# Patient Record
Sex: Male | Born: 1970
Health system: Southern US, Community
[De-identification: ages and names within clinical notes are randomized; demographics above are authoritative.]

## PROBLEM LIST (undated history)

## (undated) DIAGNOSIS — D649 Anemia, unspecified: Secondary | ICD-10-CM

## (undated) DIAGNOSIS — I10 Essential (primary) hypertension: Secondary | ICD-10-CM

## (undated) DIAGNOSIS — H3321 Serous retinal detachment, right eye: Secondary | ICD-10-CM

## (undated) DIAGNOSIS — J189 Pneumonia, unspecified organism: Secondary | ICD-10-CM

## (undated) DIAGNOSIS — E119 Type 2 diabetes mellitus without complications: Secondary | ICD-10-CM

## (undated) DIAGNOSIS — N186 End stage renal disease: Secondary | ICD-10-CM

## (undated) HISTORY — PX: COLONOSCOPY: SHX174

## (undated) HISTORY — PX: RENAL BIOPSY: SHX156

## (undated) HISTORY — PX: KIDNEY TRANSPLANT: SHX239

---

## 2000-04-09 ENCOUNTER — Emergency Department (HOSPITAL_COMMUNITY): Admission: EM | Admit: 2000-04-09 | Discharge: 2000-04-09 | Payer: Self-pay | Admitting: Emergency Medicine

## 2000-04-09 ENCOUNTER — Encounter: Payer: Self-pay | Admitting: Emergency Medicine

## 2001-05-10 ENCOUNTER — Emergency Department (HOSPITAL_COMMUNITY): Admission: EM | Admit: 2001-05-10 | Discharge: 2001-05-10 | Payer: Self-pay | Admitting: Emergency Medicine

## 2002-08-16 ENCOUNTER — Emergency Department (HOSPITAL_COMMUNITY): Admission: EM | Admit: 2002-08-16 | Discharge: 2002-08-16 | Payer: Self-pay | Admitting: Emergency Medicine

## 2002-08-17 ENCOUNTER — Encounter: Admission: RE | Admit: 2002-08-17 | Discharge: 2002-08-17 | Payer: Self-pay | Admitting: Family Medicine

## 2002-08-18 ENCOUNTER — Encounter: Admission: RE | Admit: 2002-08-18 | Discharge: 2002-08-18 | Payer: Self-pay | Admitting: Family Medicine

## 2002-08-25 ENCOUNTER — Encounter: Admission: RE | Admit: 2002-08-25 | Discharge: 2002-08-25 | Payer: Self-pay | Admitting: Family Medicine

## 2002-08-28 ENCOUNTER — Encounter: Admission: RE | Admit: 2002-08-28 | Discharge: 2002-11-26 | Payer: Self-pay | Admitting: Family Medicine

## 2002-09-20 ENCOUNTER — Encounter: Admission: RE | Admit: 2002-09-20 | Discharge: 2002-09-20 | Payer: Self-pay | Admitting: Family Medicine

## 2002-11-24 ENCOUNTER — Encounter: Admission: RE | Admit: 2002-11-24 | Discharge: 2002-11-24 | Payer: Self-pay | Admitting: Family Medicine

## 2003-07-30 ENCOUNTER — Encounter: Admission: RE | Admit: 2003-07-30 | Discharge: 2003-07-30 | Payer: Self-pay | Admitting: Family Medicine

## 2004-05-01 ENCOUNTER — Ambulatory Visit: Payer: Self-pay | Admitting: Family Medicine

## 2005-07-17 ENCOUNTER — Emergency Department (HOSPITAL_COMMUNITY): Admission: EM | Admit: 2005-07-17 | Discharge: 2005-07-17 | Payer: Self-pay | Admitting: Emergency Medicine

## 2006-09-23 DIAGNOSIS — E669 Obesity, unspecified: Secondary | ICD-10-CM | POA: Insufficient documentation

## 2006-09-23 DIAGNOSIS — I10 Essential (primary) hypertension: Secondary | ICD-10-CM | POA: Insufficient documentation

## 2006-09-23 DIAGNOSIS — F1120 Opioid dependence, uncomplicated: Secondary | ICD-10-CM | POA: Insufficient documentation

## 2006-09-23 DIAGNOSIS — E119 Type 2 diabetes mellitus without complications: Secondary | ICD-10-CM

## 2006-10-14 ENCOUNTER — Emergency Department (HOSPITAL_COMMUNITY): Admission: EM | Admit: 2006-10-14 | Discharge: 2006-10-14 | Payer: Self-pay | Admitting: Family Medicine

## 2009-04-02 ENCOUNTER — Emergency Department (HOSPITAL_COMMUNITY): Admission: EM | Admit: 2009-04-02 | Discharge: 2009-04-02 | Payer: Self-pay | Admitting: Family Medicine

## 2010-10-31 LAB — POCT I-STAT, CHEM 8
BUN: 13 mg/dL (ref 6–23)
Calcium, Ion: 1.17 mmol/L (ref 1.12–1.32)
Chloride: 97 mEq/L (ref 96–112)
Creatinine, Ser: 0.8 mg/dL (ref 0.4–1.5)
Glucose, Bld: 407 mg/dL — ABNORMAL HIGH (ref 70–99)
HCT: 47 % (ref 39.0–52.0)
Hemoglobin: 16 g/dL (ref 13.0–17.0)
Potassium: 4.6 mEq/L (ref 3.5–5.1)
Sodium: 132 mEq/L — ABNORMAL LOW (ref 135–145)
TCO2: 25 mmol/L (ref 0–100)

## 2015-11-23 ENCOUNTER — Emergency Department (HOSPITAL_COMMUNITY)
Admission: EM | Admit: 2015-11-23 | Discharge: 2015-11-23 | Disposition: A | Discharge status: Home / Self Care | Payer: Self-pay | Attending: Emergency Medicine | Admitting: Emergency Medicine

## 2015-11-23 ENCOUNTER — Encounter (HOSPITAL_COMMUNITY): Payer: Self-pay

## 2015-11-23 DIAGNOSIS — S61011A Laceration without foreign body of right thumb without damage to nail, initial encounter: Secondary | ICD-10-CM | POA: Insufficient documentation

## 2015-11-23 DIAGNOSIS — W260XXA Contact with knife, initial encounter: Secondary | ICD-10-CM | POA: Insufficient documentation

## 2015-11-23 DIAGNOSIS — F17200 Nicotine dependence, unspecified, uncomplicated: Secondary | ICD-10-CM | POA: Insufficient documentation

## 2015-11-23 DIAGNOSIS — Z7984 Long term (current) use of oral hypoglycemic drugs: Secondary | ICD-10-CM | POA: Insufficient documentation

## 2015-11-23 DIAGNOSIS — Y998 Other external cause status: Secondary | ICD-10-CM | POA: Insufficient documentation

## 2015-11-23 DIAGNOSIS — Y9389 Activity, other specified: Secondary | ICD-10-CM | POA: Insufficient documentation

## 2015-11-23 DIAGNOSIS — Y9289 Other specified places as the place of occurrence of the external cause: Secondary | ICD-10-CM | POA: Insufficient documentation

## 2015-11-23 DIAGNOSIS — IMO0002 Reserved for concepts with insufficient information to code with codable children: Secondary | ICD-10-CM

## 2015-11-23 DIAGNOSIS — E119 Type 2 diabetes mellitus without complications: Secondary | ICD-10-CM | POA: Insufficient documentation

## 2015-11-23 HISTORY — DX: Type 2 diabetes mellitus without complications: E11.9

## 2015-11-23 MED ORDER — TETANUS-DIPHTH-ACELL PERTUSSIS 5-2.5-18.5 LF-MCG/0.5 IM SUSP
0.5000 mL | Freq: Once | INTRAMUSCULAR | Status: AC
  Administered 2015-11-23: 0.5 mL via INTRAMUSCULAR
  Filled 2015-11-23: qty 0.5

## 2015-11-23 MED ORDER — LIDOCAINE HCL 2 % IJ SOLN
5.0000 mL | Freq: Once | INTRAMUSCULAR | Status: AC
  Administered 2015-11-23: 100 mg
  Filled 2015-11-23: qty 20

## 2015-11-23 NOTE — ED Notes (Signed)
Pt states he accidentally cut the knuckle of his right thumb with his pocket knife. Small 1cm laceration to right thumb, scant bleeding.

## 2015-11-23 NOTE — Discharge Instructions (Signed)

## 2015-11-23 NOTE — ED Notes (Signed)
Patient verbalized understanding of discharge instructions and denies any further needs or questions at this time. VS stable. Patient ambulatory with steady gait.  

## 2015-11-23 NOTE — ED Provider Notes (Signed)
CSN: [id number]     Arrival date & time 11/23/15  0124 History  By signing my name below, I, Emmanuella Mensah, attest that this documentation has been prepared under the direction and in the presence of Orpah Greek, MD. Electronically Signed: Judithann Sauger, ED Scribe. 11/23/2015. 1:49 AM.    Chief Complaint  Patient presents with  . Extremity Laceration   The history is provided by the patient. No language interpreter was used.   HPI Comments: Casey Preston is a 45 y.o. male with a hx of DM who presents to the Emergency Department complaining of an actively bleeding laceration to the knuckle of his right thumb s/p right thumb injury that occurred PTA. He explains that he accidentally cut his right thumb with his pocket knife. No alleviating factors noted. Pt has not tried any medications PTA. No fever or rash.    Past Medical History  Diagnosis Date  . Diabetes mellitus without complication ([place])    History reviewed. No pertinent past surgical history. No family history on file. Social History  Substance Use Topics  . Smoking status: Current Every Day Smoker  . Smokeless tobacco: None  . Alcohol Use: No    Review of Systems  Constitutional: Negative for fever.  Skin: Positive for wound. Negative for rash.  All other systems reviewed and are negative.     Allergies  Review of patient's allergies indicates no known allergies.  Home Medications   Prior to Admission medications   Medication Sig Start Date End Date Taking? Authorizing Provider  metFORMIN (GLUCOPHAGE) 500 MG tablet Take 500 mg by mouth 2 (two) times daily with a meal.   Yes Historical Provider, MD   BP 159/95 mmHg  Pulse 101  Temp(Src) 98.8 F (37.1 C) (Oral)  Resp 16  SpO2 100% Physical Exam  Constitutional: He is oriented to person, place, and time. He appears well-developed and well-nourished. No distress.  HENT:  Head: Normocephalic and atraumatic.  Right Ear: Hearing normal.  Left  Ear: Hearing normal.  Nose: Nose normal.  Mouth/Throat: Oropharynx is clear and moist and mucous membranes are normal.  Eyes: Conjunctivae and EOM are normal. Pupils are equal, round, and reactive to light.  Neck: Normal range of motion. Neck supple.  Cardiovascular: Regular rhythm, S1 normal and S2 normal.  Exam reveals no gallop and no friction rub.   No murmur heard. Pulmonary/Chest: Effort normal and breath sounds normal. No respiratory distress. He exhibits no tenderness.  Abdominal: Soft. Normal appearance and bowel sounds are normal. There is no hepatosplenomegaly. There is no tenderness. There is no rebound, no guarding, no tenderness at McBurney's point and negative Murphy's sign. No hernia.  Musculoskeletal: Normal range of motion.  Normal flexion, extension, opposition of the thumb Normal cap refill Normal sensation   Neurological: He is alert and oriented to person, place, and time. He has normal strength. No cranial nerve deficit or sensory deficit. Coordination normal. GCS eye subscore is 4. GCS verbal subscore is 5. GCS motor subscore is 6.  Skin: Skin is warm, dry and intact. No rash noted. No cyanosis.  1 cm laceration over the ulna-radial aspect over the first MCP joint.   Psychiatric: He has a normal mood and affect. His speech is normal and behavior is normal. Thought content normal.  Nursing note and vitals reviewed.   ED Course  Procedures (including critical care time)  LACERATION REPAIR Performed by: Orpah Greek. Authorized by: Orpah Greek Consent: Verbal consent obtained. Risks  and benefits: risks, benefits and alternatives were discussed Consent given by: patient Patient identity confirmed: provided demographic data Prepped and Draped in normal sterile fashion Wound explored  Laceration Location: thumb  Laceration Length: 1cm  No Foreign Bodies seen or palpated  Anesthesia: local infiltration  Local anesthetic: lidocaine 2%  without epinephrine  Anesthetic total: 1 ml  Irrigation method: syringe Amount of cleaning: standard  Skin closure: suture  Number of sutures: 2  Technique: simple interrupted, 4-0 ethilon  Patient tolerance: Patient tolerated the procedure well with no immediate complications.  DIAGNOSTIC STUDIES: Oxygen Saturation is 99% on RA, normal by my interpretation.    COORDINATION OF CARE: 1:45 AM- Pt advised of plan for treatment and pt agrees. Pt will receive a laceration repair.   MDM   Final diagnoses:  Laceration    Presents to the ER for evaluation of laceration over the MCP joint of the thumb. Patient cut it on a knife earlier. Patient has normal range of motion, normal sensation. No evidence of tendon injury. No neurovascular injury. Sutures were placed, will have sutures removed in 10 days.  I personally performed the services described in this documentation, which was scribed in my presence. The recorded information has been reviewed and is accurate.'   Orpah Greek, MD 11/23/15 [phone]

## 2016-06-26 DIAGNOSIS — J189 Pneumonia, unspecified organism: Secondary | ICD-10-CM

## 2016-06-26 HISTORY — DX: Pneumonia, unspecified organism: J18.9

## 2016-07-13 ENCOUNTER — Encounter (HOSPITAL_COMMUNITY): Payer: Self-pay | Admitting: Emergency Medicine

## 2016-07-13 DIAGNOSIS — D631 Anemia in chronic kidney disease: Secondary | ICD-10-CM | POA: Diagnosis present

## 2016-07-13 DIAGNOSIS — T383X6A Underdosing of insulin and oral hypoglycemic [antidiabetic] drugs, initial encounter: Secondary | ICD-10-CM | POA: Diagnosis present

## 2016-07-13 DIAGNOSIS — N183 Chronic kidney disease, stage 3 (moderate): Secondary | ICD-10-CM | POA: Diagnosis present

## 2016-07-13 DIAGNOSIS — Y92009 Unspecified place in unspecified non-institutional (private) residence as the place of occurrence of the external cause: Secondary | ICD-10-CM

## 2016-07-13 DIAGNOSIS — E1122 Type 2 diabetes mellitus with diabetic chronic kidney disease: Secondary | ICD-10-CM | POA: Diagnosis present

## 2016-07-13 DIAGNOSIS — N179 Acute kidney failure, unspecified: Secondary | ICD-10-CM | POA: Diagnosis present

## 2016-07-13 DIAGNOSIS — I129 Hypertensive chronic kidney disease with stage 1 through stage 4 chronic kidney disease, or unspecified chronic kidney disease: Secondary | ICD-10-CM | POA: Diagnosis present

## 2016-07-13 DIAGNOSIS — J189 Pneumonia, unspecified organism: Principal | ICD-10-CM | POA: Diagnosis present

## 2016-07-13 DIAGNOSIS — Z23 Encounter for immunization: Secondary | ICD-10-CM

## 2016-07-13 DIAGNOSIS — D696 Thrombocytopenia, unspecified: Secondary | ICD-10-CM | POA: Diagnosis present

## 2016-07-13 DIAGNOSIS — Z833 Family history of diabetes mellitus: Secondary | ICD-10-CM

## 2016-07-13 DIAGNOSIS — Z91128 Patient's intentional underdosing of medication regimen for other reason: Secondary | ICD-10-CM

## 2016-07-13 DIAGNOSIS — F17290 Nicotine dependence, other tobacco product, uncomplicated: Secondary | ICD-10-CM | POA: Diagnosis present

## 2016-07-13 LAB — CBC WITH DIFFERENTIAL/PLATELET
BASOS ABS: 0 10*3/uL (ref 0.0–0.1)
BASOS PCT: 0 %
EOS ABS: 0 10*3/uL (ref 0.0–0.7)
EOS PCT: 0 %
HCT: 22.9 % — ABNORMAL LOW (ref 39.0–52.0)
Hemoglobin: 7.9 g/dL — ABNORMAL LOW (ref 13.0–17.0)
Lymphocytes Relative: 10 %
Lymphs Abs: 0.8 10*3/uL (ref 0.7–4.0)
MCH: 31.1 pg (ref 26.0–34.0)
MCHC: 34.5 g/dL (ref 30.0–36.0)
MCV: 90.2 fL (ref 78.0–100.0)
Monocytes Absolute: 0.5 10*3/uL (ref 0.1–1.0)
Monocytes Relative: 6 %
Neutro Abs: 7 10*3/uL (ref 1.7–7.7)
Neutrophils Relative %: 84 %
PLATELETS: 102 10*3/uL — AB (ref 150–400)
RBC: 2.54 MIL/uL — AB (ref 4.22–5.81)
RDW: 11.7 % (ref 11.5–15.5)
WBC: 8.3 10*3/uL (ref 4.0–10.5)

## 2016-07-13 LAB — URINALYSIS, ROUTINE W REFLEX MICROSCOPIC
BILIRUBIN URINE: NEGATIVE
GLUCOSE, UA: 150 mg/dL — AB
Ketones, ur: NEGATIVE mg/dL
Leukocytes, UA: NEGATIVE
NITRITE: NEGATIVE
Protein, ur: >=300 mg/dL — AB
SPECIFIC GRAVITY, URINE: 1.014 (ref 1.005–1.030)
pH: 5 (ref 5.0–8.0)

## 2016-07-13 LAB — COMPREHENSIVE METABOLIC PANEL
ALT: 31 U/L (ref 17–63)
AST: 40 U/L (ref 15–41)
Albumin: 3 g/dL — ABNORMAL LOW (ref 3.5–5.0)
Alkaline Phosphatase: 115 U/L (ref 38–126)
Anion gap: 4 — ABNORMAL LOW (ref 5–15)
BUN: 38 mg/dL — AB (ref 6–20)
CHLORIDE: 104 mmol/L (ref 101–111)
CO2: 27 mmol/L (ref 22–32)
CREATININE: 3.14 mg/dL — AB (ref 0.61–1.24)
Calcium: 8.7 mg/dL — ABNORMAL LOW (ref 8.9–10.3)
GFR calc non Af Amer: 23 mL/min — ABNORMAL LOW (ref >60)
GFR, EST AFRICAN AMERICAN: 26 mL/min — AB (ref >60)
Glucose, Bld: 121 mg/dL — ABNORMAL HIGH (ref 65–99)
POTASSIUM: 4.4 mmol/L (ref 3.5–5.1)
SODIUM: 135 mmol/L (ref 135–145)
Total Bilirubin: 1.2 mg/dL (ref 0.3–1.2)
Total Protein: 5.8 g/dL — ABNORMAL LOW (ref 6.5–8.1)

## 2016-07-13 MED ORDER — ONDANSETRON 4 MG PO TBDP
4.0000 mg | ORAL_TABLET | Freq: Once | ORAL | Status: AC
  Administered 2016-07-13: 4 mg via ORAL

## 2016-07-13 MED ORDER — ACETAMINOPHEN 325 MG PO TABS
650.0000 mg | ORAL_TABLET | Freq: Once | ORAL | Status: AC
  Administered 2016-07-13: 650 mg via ORAL

## 2016-07-13 MED ORDER — ACETAMINOPHEN 325 MG PO TABS
ORAL_TABLET | ORAL | Status: AC
  Filled 2016-07-13: qty 2

## 2016-07-13 MED ORDER — ONDANSETRON 4 MG PO TBDP
ORAL_TABLET | ORAL | Status: AC
  Filled 2016-07-13: qty 1

## 2016-07-13 NOTE — ED Triage Notes (Signed)
Pt states he has had N/V/D for past 2 days. Pt states he feels weak and tired for past week. Pt also complains of congestion. Pt states he was around his grandson who was sick this past weekend.

## 2016-07-14 ENCOUNTER — Encounter (HOSPITAL_COMMUNITY): Payer: Self-pay | Admitting: Emergency Medicine

## 2016-07-14 ENCOUNTER — Emergency Department (HOSPITAL_COMMUNITY): Payer: Self-pay

## 2016-07-14 ENCOUNTER — Inpatient Hospital Stay (HOSPITAL_COMMUNITY): Payer: Self-pay

## 2016-07-14 ENCOUNTER — Inpatient Hospital Stay (HOSPITAL_COMMUNITY)
Admission: EM | Admit: 2016-07-14 | Discharge: 2016-07-16 | DRG: 194 | Disposition: A | Discharge status: Home / Self Care | Payer: Self-pay | Attending: Internal Medicine | Admitting: Internal Medicine

## 2016-07-14 DIAGNOSIS — D593 Hemolytic-uremic syndrome, unspecified: Secondary | ICD-10-CM

## 2016-07-14 DIAGNOSIS — I10 Essential (primary) hypertension: Secondary | ICD-10-CM | POA: Diagnosis present

## 2016-07-14 DIAGNOSIS — N179 Acute kidney failure, unspecified: Secondary | ICD-10-CM | POA: Diagnosis present

## 2016-07-14 DIAGNOSIS — J189 Pneumonia, unspecified organism: Secondary | ICD-10-CM | POA: Diagnosis present

## 2016-07-14 DIAGNOSIS — E119 Type 2 diabetes mellitus without complications: Secondary | ICD-10-CM

## 2016-07-14 DIAGNOSIS — D696 Thrombocytopenia, unspecified: Secondary | ICD-10-CM | POA: Diagnosis present

## 2016-07-14 DIAGNOSIS — D649 Anemia, unspecified: Secondary | ICD-10-CM | POA: Diagnosis present

## 2016-07-14 DIAGNOSIS — M7989 Other specified soft tissue disorders: Secondary | ICD-10-CM

## 2016-07-14 HISTORY — DX: Essential (primary) hypertension: I10

## 2016-07-14 LAB — RESPIRATORY PANEL BY PCR
Adenovirus: NOT DETECTED
BORDETELLA PERTUSSIS-RVPCR: NOT DETECTED
CORONAVIRUS HKU1-RVPPCR: NOT DETECTED
Chlamydophila pneumoniae: NOT DETECTED
Coronavirus 229E: NOT DETECTED
Coronavirus NL63: NOT DETECTED
Coronavirus OC43: DETECTED — AB
Influenza A: NOT DETECTED
Influenza B: NOT DETECTED
METAPNEUMOVIRUS-RVPPCR: NOT DETECTED
Mycoplasma pneumoniae: NOT DETECTED
PARAINFLUENZA VIRUS 1-RVPPCR: NOT DETECTED
PARAINFLUENZA VIRUS 2-RVPPCR: NOT DETECTED
Parainfluenza Virus 3: NOT DETECTED
Parainfluenza Virus 4: NOT DETECTED
RHINOVIRUS / ENTEROVIRUS - RVPPCR: NOT DETECTED
Respiratory Syncytial Virus: DETECTED — AB

## 2016-07-14 LAB — GLUCOSE, CAPILLARY
GLUCOSE-CAPILLARY: 119 mg/dL — AB (ref 65–99)
GLUCOSE-CAPILLARY: 205 mg/dL — AB (ref 65–99)
GLUCOSE-CAPILLARY: 76 mg/dL (ref 65–99)
Glucose-Capillary: 142 mg/dL — ABNORMAL HIGH (ref 65–99)
Glucose-Capillary: 203 mg/dL — ABNORMAL HIGH (ref 65–99)

## 2016-07-14 LAB — BASIC METABOLIC PANEL
ANION GAP: 8 (ref 5–15)
BUN: 42 mg/dL — ABNORMAL HIGH (ref 6–20)
CALCIUM: 8.1 mg/dL — AB (ref 8.9–10.3)
CO2: 23 mmol/L (ref 22–32)
CREATININE: 3.29 mg/dL — AB (ref 0.61–1.24)
Chloride: 104 mmol/L (ref 101–111)
GFR, EST AFRICAN AMERICAN: 25 mL/min — AB (ref >60)
GFR, EST NON AFRICAN AMERICAN: 21 mL/min — AB (ref >60)
Glucose, Bld: 144 mg/dL — ABNORMAL HIGH (ref 65–99)
Potassium: 4.3 mmol/L (ref 3.5–5.1)
SODIUM: 135 mmol/L (ref 135–145)

## 2016-07-14 LAB — APTT: aPTT: 44 seconds — ABNORMAL HIGH (ref 24–36)

## 2016-07-14 LAB — PROTEIN / CREATININE RATIO, URINE
CREATININE, URINE: 168.38 mg/dL
Protein Creatinine Ratio: 4.64 mg/mg{Cre} — ABNORMAL HIGH (ref 0.00–0.15)
Total Protein, Urine: 781 mg/dL

## 2016-07-14 LAB — CBC WITH DIFFERENTIAL/PLATELET
BASOS ABS: 0 10*3/uL (ref 0.0–0.1)
Basophils Relative: 0 %
EOS ABS: 0 10*3/uL (ref 0.0–0.7)
Eosinophils Relative: 0 %
HEMATOCRIT: 21.2 % — AB (ref 39.0–52.0)
Hemoglobin: 7.3 g/dL — ABNORMAL LOW (ref 13.0–17.0)
LYMPHS ABS: 1.3 10*3/uL (ref 0.7–4.0)
Lymphocytes Relative: 15 %
MCH: 31.7 pg (ref 26.0–34.0)
MCHC: 34.4 g/dL (ref 30.0–36.0)
MCV: 92.2 fL (ref 78.0–100.0)
MONOS PCT: 7 %
Monocytes Absolute: 0.6 10*3/uL (ref 0.1–1.0)
NEUTROS ABS: 7 10*3/uL (ref 1.7–7.7)
Neutrophils Relative %: 78 %
PLATELETS: 84 10*3/uL — AB (ref 150–400)
RBC: 2.3 MIL/uL — AB (ref 4.22–5.81)
RDW: 11.9 % (ref 11.5–15.5)
WBC: 8.9 10*3/uL (ref 4.0–10.5)

## 2016-07-14 LAB — CBC
HEMATOCRIT: 20.7 % — AB (ref 39.0–52.0)
HEMOGLOBIN: 7 g/dL — AB (ref 13.0–17.0)
MCH: 30.4 pg (ref 26.0–34.0)
MCHC: 33.8 g/dL (ref 30.0–36.0)
MCV: 90 fL (ref 78.0–100.0)
Platelets: 98 10*3/uL — ABNORMAL LOW (ref 150–400)
RBC: 2.3 MIL/uL — AB (ref 4.22–5.81)
RDW: 11.6 % (ref 11.5–15.5)
WBC: 7.4 10*3/uL (ref 4.0–10.5)

## 2016-07-14 LAB — RAPID URINE DRUG SCREEN, HOSP PERFORMED
AMPHETAMINES: NOT DETECTED
BARBITURATES: NOT DETECTED
Benzodiazepines: NOT DETECTED
Cocaine: NOT DETECTED
Opiates: NOT DETECTED
TETRAHYDROCANNABINOL: POSITIVE — AB

## 2016-07-14 LAB — FIBRINOGEN: FIBRINOGEN: 547 mg/dL — AB (ref 210–475)

## 2016-07-14 LAB — LIPASE, BLOOD: Lipase: 22 U/L (ref 11–51)

## 2016-07-14 LAB — PROTIME-INR
INR: 1.05
Prothrombin Time: 13.7 seconds (ref 11.4–15.2)

## 2016-07-14 LAB — LACTATE DEHYDROGENASE: LDH: 387 U/L — AB (ref 98–192)

## 2016-07-14 LAB — SAVE SMEAR

## 2016-07-14 LAB — SEDIMENTATION RATE: Sed Rate: 60 mm/hr — ABNORMAL HIGH (ref 0–16)

## 2016-07-14 LAB — I-STAT CG4 LACTIC ACID, ED: LACTIC ACID, VENOUS: 0.94 mmol/L (ref 0.5–1.9)

## 2016-07-14 LAB — D-DIMER, QUANTITATIVE (NOT AT ARMC): D DIMER QUANT: 2.11 ug{FEU}/mL — AB (ref 0.00–0.50)

## 2016-07-14 LAB — C-REACTIVE PROTEIN: CRP: 8 mg/dL — AB (ref <1.0)

## 2016-07-14 MED ORDER — METOPROLOL TARTRATE 5 MG/5ML IV SOLN
5.0000 mg | Freq: Four times a day (QID) | INTRAVENOUS | Status: DC | PRN
  Administered 2016-07-16: 5 mg via INTRAVENOUS
  Filled 2016-07-14: qty 5

## 2016-07-14 MED ORDER — INSULIN ASPART 100 UNIT/ML ~~LOC~~ SOLN
0.0000 [IU] | Freq: Three times a day (TID) | SUBCUTANEOUS | Status: DC
  Administered 2016-07-14 – 2016-07-15 (×2): 3 [IU] via SUBCUTANEOUS
  Administered 2016-07-16: 2 [IU] via SUBCUTANEOUS
  Administered 2016-07-16: 1 [IU] via SUBCUTANEOUS

## 2016-07-14 MED ORDER — DEXTROSE 5 % IV SOLN
500.0000 mg | Freq: Once | INTRAVENOUS | Status: AC
  Administered 2016-07-14: 500 mg via INTRAVENOUS
  Filled 2016-07-14: qty 500

## 2016-07-14 MED ORDER — SODIUM CHLORIDE 0.9 % IV SOLN: INTRAVENOUS | Status: DC

## 2016-07-14 MED ORDER — PROMETHAZINE HCL 25 MG PO TABS: 12.5000 mg | ORAL_TABLET | Freq: Four times a day (QID) | ORAL | Status: DC | PRN

## 2016-07-14 MED ORDER — DEXTROSE 5 % IV SOLN
1.0000 g | INTRAVENOUS | Status: DC
  Administered 2016-07-14 – 2016-07-16 (×2): 1 g via INTRAVENOUS
  Filled 2016-07-14 (×2): qty 10

## 2016-07-14 MED ORDER — CEFTRIAXONE SODIUM 1 G IJ SOLR
1.0000 g | Freq: Once | INTRAMUSCULAR | Status: AC
  Administered 2016-07-14: 1 g via INTRAVENOUS
  Filled 2016-07-14: qty 10

## 2016-07-14 MED ORDER — DEXTROSE 5 % IV SOLN
500.0000 mg | INTRAVENOUS | Status: DC
  Administered 2016-07-15 – 2016-07-16 (×2): 500 mg via INTRAVENOUS
  Filled 2016-07-14 (×2): qty 500

## 2016-07-14 MED ORDER — HYDRALAZINE HCL 20 MG/ML IJ SOLN
10.0000 mg | INTRAMUSCULAR | Status: DC | PRN
Start: 2016-07-14 — End: 2016-07-14
  Administered 2016-07-14: 10 mg via INTRAVENOUS
  Filled 2016-07-14: qty 1

## 2016-07-14 MED ORDER — ACETAMINOPHEN 650 MG RE SUPP: 650.0000 mg | Freq: Four times a day (QID) | RECTAL | Status: DC | PRN

## 2016-07-14 MED ORDER — PNEUMOCOCCAL VAC POLYVALENT 25 MCG/0.5ML IJ INJ
0.5000 mL | INJECTION | INTRAMUSCULAR | Status: AC
  Administered 2016-07-15: 0.5 mL via INTRAMUSCULAR
  Filled 2016-07-14: qty 0.5

## 2016-07-14 MED ORDER — SODIUM CHLORIDE 0.9 % IV BOLUS (SEPSIS)
1500.0000 mL | Freq: Once | INTRAVENOUS | Status: AC
  Administered 2016-07-14: 1500 mL via INTRAVENOUS

## 2016-07-14 MED ORDER — IPRATROPIUM-ALBUTEROL 0.5-2.5 (3) MG/3ML IN SOLN: 3.0000 mL | RESPIRATORY_TRACT | Status: DC | PRN

## 2016-07-14 MED ORDER — INSULIN ASPART 100 UNIT/ML ~~LOC~~ SOLN: 0.0000 [IU] | Freq: Every day | SUBCUTANEOUS | Status: DC

## 2016-07-14 MED ORDER — HYDRALAZINE HCL 20 MG/ML IJ SOLN
10.0000 mg | INTRAMUSCULAR | Status: DC | PRN
  Administered 2016-07-16: 10 mg via INTRAVENOUS
  Filled 2016-07-14: qty 1

## 2016-07-14 MED ORDER — SODIUM CHLORIDE 0.9 % IV BOLUS (SEPSIS)
1000.0000 mL | Freq: Once | INTRAVENOUS | Status: AC
  Administered 2016-07-14: 1000 mL via INTRAVENOUS

## 2016-07-14 MED ORDER — ACETAMINOPHEN 325 MG PO TABS: 650.0000 mg | ORAL_TABLET | Freq: Four times a day (QID) | ORAL | Status: DC | PRN

## 2016-07-14 MED ORDER — METOPROLOL TARTRATE 5 MG/5ML IV SOLN
10.0000 mg | Freq: Once | INTRAVENOUS | Status: AC
  Administered 2016-07-14: 10 mg via INTRAVENOUS
  Filled 2016-07-14: qty 10

## 2016-07-14 MED ORDER — ONDANSETRON HCL 4 MG/2ML IJ SOLN
4.0000 mg | Freq: Once | INTRAMUSCULAR | Status: AC
  Administered 2016-07-14: 4 mg via INTRAVENOUS
  Filled 2016-07-14: qty 2

## 2016-07-14 MED ORDER — OXYCODONE HCL 5 MG PO TABS: 5.0000 mg | ORAL_TABLET | ORAL | Status: DC | PRN

## 2016-07-14 MED ORDER — SODIUM CHLORIDE 0.9 % IV SOLN
INTRAVENOUS | Status: DC
  Administered 2016-07-14: 125 mL via INTRAVENOUS
  Administered 2016-07-15: 22:00:00 via INTRAVENOUS

## 2016-07-14 NOTE — ED Provider Notes (Addendum)
[facility] DEPT Provider Note   CSN: [id number] Arrival date & time: 07/13/16  2100  By signing my name below, I, Delton Prairie, attest that this documentation has been prepared under the direction and in the presence of Merryl Hacker, MD  Electronically Signed: Delton Prairie, ED Scribe. 07/14/16. 1:03 AM.   History   Chief Complaint Chief Complaint  Patient presents with  . Nausea    The history is provided by the patient. No language interpreter was used.   HPI Comments:  Casey Preston is a 45 y.o. male, with a hx of DM, who presents to the Emergency Department complaining of sudden onset, persistent generalized fatigue x 4 days. Pt also reports fevers (tmax 102), a cough, nausea, vomiting, abdominal pain and diarrhea. He also notes sick contacts at home. No alleviating factors noted. Pt denies dysuria, hematuria, chest pain, hx of kidney disease, excessive aleve or Advil use, any other associated symptoms and any other modifying factors at this time. Pt is on Metformin for his DM but does not regularly take this medication. He notes tobacco use but denies alcohol use. States that he has been fatigued for over 2 weeks but has worsened over last 4 days.  PCP: Kandice Hams, MD  Past Medical History:  Diagnosis Date  . Diabetes mellitus without complication ([place])   . Hypertension     Patient Active Problem List   Diagnosis Date Noted  . 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 DIABETES MELLITUS Preston, UNCOMPLICATED 25/11/3974  . OBESITY, NOS 09/23/2006  . DRUG DEPENDENCE 09/23/2006  . HYPERTENSION, BENIGN SYSTEMIC 09/23/2006    History reviewed. No pertinent surgical history.     Home Medications    Prior to Admission medications   Not on File    Family History No family history on file.  Social History Social History  Substance Use Topics  . Smoking status: Never Smoker  . Smokeless tobacco: Current User    Types: Snuff  . Alcohol use No      Allergies   Patient has no known allergies.   Review of Systems Review of Systems  Constitutional: Positive for fatigue and fever.  Respiratory: Positive for cough.   Cardiovascular: Negative for chest pain.  Gastrointestinal: Positive for abdominal pain, diarrhea, nausea and vomiting.  Genitourinary: Negative for dysuria and hematuria.  All other systems reviewed and are negative.  Physical Exam Updated Vital Signs BP 188/100   Pulse 95   Temp 98.9 F (37.2 C) (Oral)   Resp 13   Ht 5\' 10"  (1.778 m)   Wt 180 lb (81.6 kg)   SpO2 98%   BMI 25.83 kg/m   Physical Exam  Constitutional: He is oriented to person, place, and time. He appears well-developed and well-nourished. No distress.  HENT:  Head: Normocephalic and atraumatic.  Conjunctiva pale, mucous membranes dry  Eyes: Pupils are equal, round, and reactive to light.  Cardiovascular: Normal rate, regular rhythm and normal heart sounds.   No murmur heard. Pulmonary/Chest: Effort normal and breath sounds normal. No respiratory distress. He has no wheezes.  Abdominal: Soft. Bowel sounds are normal. There is no tenderness. There is no rebound.  Musculoskeletal: He exhibits no edema.  Neurological: He is alert and oriented to person, place, and time.  Skin: Skin is warm and dry.  Psychiatric: He has a normal mood and affect.  Nursing note and vitals reviewed.    ED Treatments / Results  DIAGNOSTIC STUDIES:  Oxygen Saturation is 98%  on RA, normal by my interpretation.    COORDINATION OF CARE:  12:52 AM Discussed treatment plan with pt at bedside and pt agreed to plan.  Labs (all labs ordered are listed, but only abnormal results are displayed) Labs Reviewed  CBC WITH DIFFERENTIAL/PLATELET - Abnormal; Notable for the following:       Result Value   RBC 2.54 (*)    Hemoglobin 7.9 (*)    HCT 22.9 (*)    Platelets 102 (*)    All other components within normal limits  COMPREHENSIVE METABOLIC PANEL -  Abnormal; Notable for the following:    Glucose, Bld 121 (*)    BUN 38 (*)    Creatinine, Ser 3.14 (*)    Calcium 8.7 (*)    Total Protein 5.8 (*)    Albumin 3.0 (*)    GFR calc non Af Amer 23 (*)    GFR calc Af Amer 26 (*)    Anion gap 4 (*)    All other components within normal limits  URINALYSIS, ROUTINE W REFLEX MICROSCOPIC - Abnormal; Notable for the following:    APPearance CLOUDY (*)    Glucose, UA 150 (*)    Hgb urine dipstick LARGE (*)    Protein, ur >=300 (*)    Bacteria, UA RARE (*)    Squamous Epithelial / LPF 0-5 (*)    All other components within normal limits  LACTATE DEHYDROGENASE - Abnormal; Notable for the following:    LDH 387 (*)    All other components within normal limits  CULTURE, BLOOD (ROUTINE X 2)  CULTURE, BLOOD (ROUTINE X 2)  CULTURE, BLOOD (ROUTINE X 2)  CULTURE, BLOOD (ROUTINE X 2)  LIPASE, BLOOD  SAVE SMEAR  PROTEIN / CREATININE RATIO, URINE  I-STAT CG4 LACTIC ACID, ED  POC OCCULT BLOOD, ED    EKG  EKG Interpretation  Date/Time:  Tuesday July 14 2016 03:12:40 EST Ventricular Rate:  97 PR Interval:    QRS Duration: 81 QT Interval:  362 QTC Calculation: 460 R Axis:   62 Text Interpretation:  Sinus rhythm Confirmed by Dina Rich  MD, Jimena Wieczorek ([id number]) on 07/14/2016 3:16:09 AM       Radiology Dg Chest 2 View  Result Date: 07/14/2016 CLINICAL DATA:  Cough and fever.  Hypoxia.  Several days duration. EXAM: CHEST  2 VIEW COMPARISON:  None FINDINGS: There are patchy airspace opacities in the lateral bases. This could represent pneumonia. No pleural effusion. Normal pulmonary vasculature. Unremarkable hilar and mediastinal contours. IMPRESSION: Patchy airspace opacities in the bases. This could represent multifocal pneumonia. Followup PA and lateral chest X-ray is recommended in 3-4 weeks following trial of antibiotic therapy to ensure resolution and exclude underlying malignancy. Electronically Signed   By: Andreas Newport M.D.   On:  07/14/2016 02:16   US Renal  Result Date: 07/14/2016 CLINICAL DATA:  45 year old male with acute renal insufficiency and elevated BUN and creatinine. EXAM: RENAL / URINARY TRACT ULTRASOUND COMPLETE COMPARISON:  None. FINDINGS: Right Kidney: Length: 12 cm. Mild increased echotexture. No hydronephrosis or echogenic stone. Left Kidney: Length: 11 cm. Minimal atrophy. Possible minimal increased echotexture. No mass or hydronephrosis visualized. Bladder: Appears normal for degree of bladder distention. IMPRESSION: Minimally increased renal echotexture otherwise unremarkable ultrasound. Electronically Signed   By: Anner Crete M.D.   On: 07/14/2016 01:46    Procedures Procedures (including critical care time)  Medications Ordered in ED Medications  ondansetron (ZOFRAN-ODT) 4 MG disintegrating tablet (not administered)  acetaminophen (TYLENOL) 325 MG  tablet (not administered)  cefTRIAXone (ROCEPHIN) 1 g in dextrose 5 % 50 mL IVPB (1 g Intravenous New Bag/Given 07/14/16 0255)  azithromycin (ZITHROMAX) 500 mg in dextrose 5 % 250 mL IVPB (not administered)  ondansetron (ZOFRAN-ODT) disintegrating tablet 4 mg (4 mg Oral Given 07/13/16 2137)  acetaminophen (TYLENOL) tablet 650 mg (650 mg Oral Given 07/13/16 2136)  sodium chloride 0.9 % bolus 1,000 mL (1,000 mLs Intravenous New Bag/Given 07/14/16 0159)  ondansetron (ZOFRAN) injection 4 mg (4 mg Intravenous Given 07/14/16 0159)  sodium chloride 0.9 % bolus 1,500 mL (1,500 mLs Intravenous New Bag/Given 07/14/16 0255)     Initial Impression / Assessment and Plan / ED Course  I have reviewed the triage vital signs and the nursing notes.  Pertinent labs & imaging results that were available during my care of the patient were reviewed by me and considered in my medical decision making (see chart for details).  Clinical Course     Patient presents with generalized fatigue, fever, diarrhea over the last 4 days. Also reports a two-week history of  worsening fatigue. He is nontoxic on exam. Vital signs are reassuring. Temperature 98.9. Initial temperature 101.2.  Physical exam is largely benign. Lab work is markedly abnormal. He has acute kidney injury, thrombcytopenia, anemia.  Denies history of the same. In the setting of diarrhea and acute illness, as well as fever, patient's presentation is most consistent with hemolytic uremic syndrome. Additional lab work obtained. Patient given fluids. Will admit for further workup and management.  3:16 AM X-ray reviewed given fever and cough. Concerning for multifocal pneumonia. No risk factors for age. Patient given IV Rocephin and azithromycin. Lactate was added. Blood cultures pending. He was given a total of 30 mL/kg fluid. Discuss with Dr. Tamala Julian, hospitalist. Will admit to telemetry.  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 The recorded information has been reviewed and is accurate.     Merryl Hacker, MD 07/14/16 [id number]    Merryl Hacker, MD 07/14/16 [id number]    Merryl Hacker, MD 07/14/16 [phone]

## 2016-07-14 NOTE — ED Notes (Addendum)
EDP notified of pt's BP 189/100. Pt denies chest pain or headache. No new orders at this time.

## 2016-07-14 NOTE — Progress Notes (Signed)
Received sign out regarding patient from Dr. Dina Rich Casey Preston is a 45 year old with past medical history of DM type II; who presented with generalized fatigue, fever, abd pain, and N/V. Fever up to 101.42F, HR 103 , RR 23, BP up to 194/94.  Lab workhbg 7.9,  Cr 3.14, previously wnl. Patient was initially started on supportive care as there was concern for HUS. U/S without signs of obstruction. Chest x-ray showing possible multifocal pneumonia patient was given empiric antibiotics of ceftriaxone and azithromycin.

## 2016-07-14 NOTE — ED Notes (Signed)
Patient transported to Ultrasound 

## 2016-07-14 NOTE — Progress Notes (Signed)
New Admission Note:  Arrival Method: Stretcher from ED Mental Orientation: A&O x4 Telemetry: Box 15, CCMD Notified Assessment: Completed Skin: Assessed with Dorothey Baseman, RN, no skin issues noted IV: L AC saline locked. Pain: 0/10 Tubes: N/A Safety Measures: Safety Fall Prevention Plan discussed with patient Admission: Completed [address] Orientation: Patient has been orientated to the room, unit and the staff. Family: Wife at the bedside RN unable to complete abuse/neglect assessment due to wife being at the bedside. RN notified admissions for patient's arrival to unit. Awaiting orders. Will continue to monitor the patient. Call light has been placed within reach.     Nena Polio BSN, RN  Phone Number: [phone]

## 2016-07-14 NOTE — H&P (Signed)
History and Physical    Casey Preston HVF:[id number] DOB: 10/05/1970 DOA: 07/14/2016  PCP: Kandice Hams, MD Patient coming from: Home  Chief Complaint: Generalized weakness  HPI: COBI DELPH II is a 45 y.o. male with medical history significant of diabetes and hypertension presenting with generalized fatigue. Started 2 weeks ago but became significantly worse over the last 4 days ago. Associated with fever up to 102, intermittent productive cough, nausea, vomiting, mild abdominal discomfort and diarrhea. Multiple sick contacts at home recently. Denies using cigarettes but does use chewing tobacco. Patient also endorsing lower extremity swelling which is significantly worse on the right. Continues to make urine.  ED Course: objective findings outlined below  Review of Systems: As per HPI otherwise 10 point review of systems negative.   Ambulatory Status:no restrictions  Past Medical History:  Diagnosis Date  . Diabetes mellitus without complication ([place])   . Hypertension     History reviewed. No pertinent surgical history.  Social History   Social History  . Marital status: Married    Spouse name: N/A  . Number of children: N/A  . Years of education: N/A   Occupational History  . Not on file.   Social History Main Topics  . Smoking status: Never Smoker  . Smokeless tobacco: Current User    Types: Snuff  . Alcohol use No  . Drug use:     Types: Marijuana  . Sexual activity: Not on file   Other Topics Concern  . Not on file   Social History Narrative  . No narrative on file    No Known Allergies  Family History  Problem Relation Age of Onset  . Diabetes Mother   . Diabetes Father     Prior to Admission medications   Not on File    Physical Exam: Vitals:   07/14/16 0317 07/14/16 0359 07/14/16 0821 07/14/16 1049  BP: (!) 191/104 (!) 194/94 (!) 191/94 (!) 165/89  Pulse:  (!) 102 98 88  Resp: '19 18 18   ' Temp: 98.9 F (37.2 C) 99.3 F (37.4 C)  99.2 F (37.3 C)   TempSrc: Oral Oral Oral   SpO2: 95% 94% 91% 92%  Weight:  80.2 kg (176 lb 12.8 oz)    Height:  '5\' 10"'  (1.778 m)       General:  Appears calm and comfortable Eyes:  PERRL, EOMI, normal lids, iris ENT:  grossly normal hearing, lips & tongue, mmm Neck:  no LAD, masses or thyromegaly Cardiovascular:  RRR, no m/r/g. 2+ RLE swelling and trace LLE edema Respiratory: Coarse breath sounds throughout. Intermittent wheezing. Increased effort. Abdomen:  soft, ntnd, NABS Skin:  no rash or induration seen on limited exam Musculoskeletal:  grossly normal tone BUE/BLE, good ROM, no bony abnormality Psychiatric:  grossly normal mood and affect, speech fluent and appropriate, AOx3 Neurologic:  CN 2-12 grossly intact, moves all extremities in coordinated fashion, sensation intact  Labs on Admission: I have personally reviewed following labs and imaging studies  CBC:  Recent Labs Lab 07/13/16 2132 07/14/16 0727  WBC 8.3 8.9  NEUTROABS 7.0 7.0  HGB 7.9* 7.3*  HCT 22.9* 21.2*  MCV 90.2 92.2  PLT 102* 84*   Basic Metabolic Panel:  Recent Labs Lab 07/13/16 2132 07/14/16 0727  NA 135 135  K 4.4 4.3  CL 104 104  CO2 27 23  GLUCOSE 121* 144*  BUN 38* 42*  CREATININE 3.14* 3.29*  CALCIUM 8.7* 8.1*   GFR: Estimated Creatinine Clearance:  29.6 mL/min (by C-G formula based on SCr of 3.29 mg/dL (H)). Liver Function Tests:  Recent Labs Lab 07/13/16 2132  AST 40  ALT 31  ALKPHOS 115  BILITOT 1.2  PROT 5.8*  ALBUMIN 3.0*    Recent Labs Lab 07/14/16 0150  LIPASE 22   No results for input(s): AMMONIA in the last 168 hours. Coagulation Profile: No results for input(s): INR, PROTIME in the last 168 hours. Cardiac Enzymes: No results for input(s): CKTOTAL, CKMB, CKMBINDEX, TROPONINI in the last 168 hours. BNP (last 3 results) No results for input(s): PROBNP in the last 8760 hours. HbA1C: No results for input(s): HGBA1C in the last 72 hours. CBG:  Recent  Labs Lab 07/14/16 0356 07/14/16 0742  GLUCAP 119* 142*   Lipid Profile: No results for input(s): CHOL, HDL, LDLCALC, TRIG, CHOLHDL, LDLDIRECT in the last 72 hours. Thyroid Function Tests: No results for input(s): TSH, T4TOTAL, FREET4, T3FREE, THYROIDAB in the last 72 hours. Anemia Panel: No results for input(s): VITAMINB12, FOLATE, FERRITIN, TIBC, IRON, RETICCTPCT in the last 72 hours. Urine analysis:    Component Value Date/Time   COLORURINE YELLOW 07/13/2016 2132   APPEARANCEUR CLOUDY (A) 07/13/2016 2132   LABSPEC 1.014 07/13/2016 2132   PHURINE 5.0 07/13/2016 2132   GLUCOSEU 150 (A) 07/13/2016 2132   HGBUR LARGE (A) 07/13/2016 2132   BILIRUBINUR NEGATIVE 07/13/2016 2132   KETONESUR NEGATIVE 07/13/2016 2132   PROTEINUR >=300 (A) 07/13/2016 2132   NITRITE NEGATIVE 07/13/2016 2132   LEUKOCYTESUR NEGATIVE 07/13/2016 2132    Creatinine Clearance: Estimated Creatinine Clearance: 29.6 mL/min (by C-G formula based on SCr of 3.29 mg/dL (H)).  Sepsis Labs: '@LABRCNTIP' (procalcitonin:4,lacticidven:4) )No results found for this or any previous visit (from the past 240 hour(s)).   Radiological Exams on Admission: Dg Chest 2 View  Result Date: 07/14/2016 CLINICAL DATA:  Cough and fever.  Hypoxia.  Several days duration. EXAM: CHEST  2 VIEW COMPARISON:  None FINDINGS: There are patchy airspace opacities in the lateral bases. This could represent pneumonia. No pleural effusion. Normal pulmonary vasculature. Unremarkable hilar and mediastinal contours. IMPRESSION: Patchy airspace opacities in the bases. This could represent multifocal pneumonia. Followup PA and lateral chest X-ray is recommended in 3-4 weeks following trial of antibiotic therapy to ensure resolution and exclude underlying malignancy. Electronically Signed   By: Andreas Newport M.D.   On: 07/14/2016 02:16   US Renal  Result Date: 07/14/2016 CLINICAL DATA:  45 year old male with acute renal insufficiency and elevated BUN  and creatinine. EXAM: RENAL / URINARY TRACT ULTRASOUND COMPLETE COMPARISON:  None. FINDINGS: Right Kidney: Length: 12 cm. Mild increased echotexture. No hydronephrosis or echogenic stone. Left Kidney: Length: 11 cm. Minimal atrophy. Possible minimal increased echotexture. No mass or hydronephrosis visualized. Bladder: Appears normal for degree of bladder distention. IMPRESSION: Minimally increased renal echotexture otherwise unremarkable ultrasound. Electronically Signed   By: Anner Crete M.D.   On: 07/14/2016 01:46    EKG: Independently reviewed. Sinus . No ACS   Assessment/Plan Active Problems:   HUS (hemolytic uremic syndrome) (HCC)   Diabetes mellitus type 2, diet-controlled ([place])   Essential hypertension   Anemia   Thrombocytopenia (HCC)   Multifocal pneumonia   Acute renal failure (HCC)    Multifocal pneumonia: Noted on chest x-ray and symptomatic. - Pneumonia order set - Ceftriaxone and azithromycin - O2 when necessary comfort or hypoxemia - Follow-up cultures - Procalcitonin - Respiratory viral panel  Possible HUS: Creatinine 3.29, hemoglobin 7.3, platelets 84. All values previously normal. In setting  of acute illness may be related to HUS. Workup ongoing. - Peripheral smear, d-dimer, fibrinogen, ESR, CRP, complement, coags, serial CBC - Management of other conditions as outlined  Acute renal failure: Creatinine 3.29. BUN 42. Baseline creatinine 0.8, BUN 13. Suspect secondary to current infectious process and possible HUS. - Aggressive IVF - Treatment of pneumonia as above - SPEP/UPEP - renal ultrasound  - Nephrology if not improving   Thrombocytopenia/anemia: Down trending over the course of 8 hours since presentation to the ED with hemoglobin drop from 7.97.3 and platelet drop from 102-84. Baseline hemoglobin 16. - Treatment as above - CBC Q8 - Anemia panel - FOBT  DM: Not taking medications - SSI - A1c  HTN: not on any medications - Hydralazine,  Metoprolol PRN - Rx new regimen after acute illness resolves.    DVT prophylaxis: none - thrombocytopenia and concern for DVT so no SCD at time of admission  Code Status: full  Family Communication: wife  Disposition Plan: pending workup   Consults called: none  Admission status: inpt    Joellen Tullos J MD [facility]  If 7PM-7AM, please contact night-coverage [email or web address] Password [email or web address]  07/14/2016, 10:56 AM

## 2016-07-14 NOTE — Progress Notes (Signed)
**  Preliminary report by tech**  Bilateral lower extremity venous duplex completed. There is no evidence of deep or superficial vein thrombosis involving the right and left lower extremities. All visualized vessels appear patent and compressible. There is no evidence of Baker's cysts bilaterally.  07/14/16 1:37 PM Carlos Levering RVT

## 2016-07-15 LAB — HEMOGLOBIN A1C
HEMOGLOBIN A1C: 5.2 % (ref 4.8–5.6)
Mean Plasma Glucose: 103 mg/dL

## 2016-07-15 LAB — CBC
HCT: 19.1 % — ABNORMAL LOW (ref 39.0–52.0)
HCT: 20.7 % — ABNORMAL LOW (ref 39.0–52.0)
HEMATOCRIT: 19.1 % — AB (ref 39.0–52.0)
HEMOGLOBIN: 6.7 g/dL — AB (ref 13.0–17.0)
HEMOGLOBIN: 7 g/dL — AB (ref 13.0–17.0)
Hemoglobin: 6.6 g/dL — CL (ref 13.0–17.0)
MCH: 31.6 pg (ref 26.0–34.0)
MCH: 31.6 pg (ref 26.0–34.0)
MCH: 30.2 pg (ref 26.0–34.0)
MCHC: 34.6 g/dL (ref 30.0–36.0)
MCHC: 35.1 g/dL (ref 30.0–36.0)
MCHC: 33.8 g/dL (ref 30.0–36.0)
MCV: 91.4 fL (ref 78.0–100.0)
MCV: 90.1 fL (ref 78.0–100.0)
MCV: 89.2 fL (ref 78.0–100.0)
PLATELETS: 82 10*3/uL — AB (ref 150–400)
Platelets: 94 10*3/uL — ABNORMAL LOW (ref 150–400)
Platelets: 100 10*3/uL — ABNORMAL LOW (ref 150–400)
RBC: 2.09 MIL/uL — AB (ref 4.22–5.81)
RBC: 2.12 MIL/uL — ABNORMAL LOW (ref 4.22–5.81)
RBC: 2.32 MIL/uL — AB (ref 4.22–5.81)
RDW: 11.8 % (ref 11.5–15.5)
RDW: 11.6 % (ref 11.5–15.5)
RDW: 13.1 % (ref 11.5–15.5)
WBC: 7.5 10*3/uL (ref 4.0–10.5)
WBC: 7.8 10*3/uL (ref 4.0–10.5)
WBC: 7.7 10*3/uL (ref 4.0–10.5)

## 2016-07-15 LAB — GLUCOSE, CAPILLARY
GLUCOSE-CAPILLARY: 159 mg/dL — AB (ref 65–99)
GLUCOSE-CAPILLARY: 105 mg/dL — AB (ref 65–99)
GLUCOSE-CAPILLARY: 153 mg/dL — AB (ref 65–99)
Glucose-Capillary: 125 mg/dL — ABNORMAL HIGH (ref 65–99)
Glucose-Capillary: 161 mg/dL — ABNORMAL HIGH (ref 65–99)
Glucose-Capillary: 236 mg/dL — ABNORMAL HIGH (ref 65–99)

## 2016-07-15 LAB — STREP PNEUMONIAE URINARY ANTIGEN: Strep Pneumo Urinary Antigen: NEGATIVE

## 2016-07-15 LAB — C3 COMPLEMENT: C3 COMPLEMENT: 111 mg/dL (ref 82–167)

## 2016-07-15 LAB — DIFFERENTIAL
BASOS PCT: 0 %
Basophils Absolute: 0 10*3/uL (ref 0.0–0.1)
EOS ABS: 0.2 10*3/uL (ref 0.0–0.7)
EOS PCT: 2 %
LYMPHS PCT: 14 %
Lymphs Abs: 1.1 10*3/uL (ref 0.7–4.0)
MONOS PCT: 9 %
Monocytes Absolute: 0.8 10*3/uL (ref 0.1–1.0)
NEUTROS ABS: 6 10*3/uL (ref 1.7–7.7)
Neutrophils Relative %: 75 %

## 2016-07-15 LAB — VITAMIN B12: VITAMIN B 12: 347 pg/mL (ref 180–914)

## 2016-07-15 LAB — C4 COMPLEMENT: Complement C4, Body Fluid: 37 mg/dL (ref 14–44)

## 2016-07-15 LAB — COMPREHENSIVE METABOLIC PANEL
ALBUMIN: 2.5 g/dL — AB (ref 3.5–5.0)
ALT: 28 U/L (ref 17–63)
ANION GAP: 6 (ref 5–15)
AST: 37 U/L (ref 15–41)
Alkaline Phosphatase: 89 U/L (ref 38–126)
BILIRUBIN TOTAL: 0.7 mg/dL (ref 0.3–1.2)
BUN: 39 mg/dL — ABNORMAL HIGH (ref 6–20)
CO2: 23 mmol/L (ref 22–32)
Calcium: 8.4 mg/dL — ABNORMAL LOW (ref 8.9–10.3)
Chloride: 107 mmol/L (ref 101–111)
Creatinine, Ser: 3.15 mg/dL — ABNORMAL HIGH (ref 0.61–1.24)
GFR calc Af Amer: 26 mL/min — ABNORMAL LOW (ref >60)
GFR calc non Af Amer: 22 mL/min — ABNORMAL LOW (ref >60)
GLUCOSE: 160 mg/dL — AB (ref 65–99)
POTASSIUM: 4.1 mmol/L (ref 3.5–5.1)
Sodium: 136 mmol/L (ref 135–145)
TOTAL PROTEIN: 5.2 g/dL — AB (ref 6.5–8.1)

## 2016-07-15 LAB — HIV ANTIBODY (ROUTINE TESTING W REFLEX): HIV SCREEN 4TH GENERATION: NONREACTIVE

## 2016-07-15 LAB — COMPLEMENT, TOTAL

## 2016-07-15 LAB — ABO/RH: ABO/RH(D): O POS

## 2016-07-15 LAB — TECHNOLOGIST SMEAR REVIEW

## 2016-07-15 LAB — PREPARE RBC (CROSSMATCH)

## 2016-07-15 LAB — DIRECT ANTIGLOBULIN TEST (NOT AT ARMC)
DAT, IGG: NEGATIVE
DAT, complement: NEGATIVE

## 2016-07-15 LAB — OCCULT BLOOD X 1 CARD TO LAB, STOOL: Fecal Occult Bld: NEGATIVE

## 2016-07-15 LAB — TSH: TSH: 4.649 u[IU]/mL — AB (ref 0.350–4.500)

## 2016-07-15 MED ORDER — SODIUM CHLORIDE 0.9 % IV SOLN
Freq: Once | INTRAVENOUS | Status: AC
  Administered 2016-07-15: 16:00:00 via INTRAVENOUS

## 2016-07-15 MED ORDER — HYDRALAZINE HCL 50 MG PO TABS
50.0000 mg | ORAL_TABLET | Freq: Three times a day (TID) | ORAL | Status: DC
  Administered 2016-07-15 – 2016-07-16 (×5): 50 mg via ORAL
  Filled 2016-07-15 (×5): qty 1

## 2016-07-15 MED ORDER — ISOSORBIDE MONONITRATE ER 30 MG PO TB24
30.0000 mg | ORAL_TABLET | Freq: Every day | ORAL | Status: DC
  Administered 2016-07-15 – 2016-07-16 (×2): 30 mg via ORAL
  Filled 2016-07-15 (×2): qty 1

## 2016-07-15 NOTE — Progress Notes (Signed)
[facility] Progress Note  Patient: Casey Preston OVF:[id number]   PCP: Kandice Hams, MD DOB: [birth year]   DOA: 07/14/2016   DOS: 07/15/2016   Date of Service: the patient was seen and examined on 07/15/2016  Brief hospital course: Pt. with PMH of Diabetes; admitted on 07/14/2016, with complaint of shortness of breath and fatigue, was found to have acute kidney injury. Currently further plan is continue IV hydration.  Assessment and Plan: 1. Multifocal pneumonia Chest x-ray shows multifocal pneumonia. Along with acute kidney injury this finding could represent multiple situation, sepsis induced multiorgan damage, vasculitis syndrome involving kidney and lung. There was initial consideration off hemolytic syndrome but with normal fibrinogen level, normal INR and no schistocytes in the blood this finding is highly unlikely. continue IV ceftriaxone and azithromycin. Follow blood cultures as well. RSV and corona virus positive.  2. Acute kidney injury. His findings appears likely chronic kidney disease in the setting of chronic noncompliance with medications for diabetes as well as hypertension. Ultrasound renal unremarkable. Patient is urinating more than a liter a day. Patient denies any urinary symptoms. Urinalysis is unremarkable for any concerning finding. Vasculitis panel has been sent. Further workup in the morning. Discussed with nephrology recommends outpatient follow-up to establish care.  3. Essential hypertension. Antihypertensive medications added. Monitor.  4. History of diabetes. Hemoglobin A1c less than 6. Patient supposed to be on metformin as an outpatient but has not been taking on a regular basis. Currently we will be on sliding scale insulin.  5. Anemia. From was at opinion. Likely chronic in nature given the appearance of the peripheral smear as well as stabilization of the count. Hemoglobin less than 7 we'll transfuse 1 unit PRBC. Monitor PRBC  after transfusion. FOBT negative no evidence of hemodialysis.   Pain management: When necessary Tylenol Activity: No indication for physical therapy Bowel regimen: last BM prior to admission Diet: Renal diet carb modified DVT Prophylaxis: SCD  Advance goals of care discussion: Full code  Family Communication: family was present at bedside, at the time of interview. The pt provided permission to discuss medical plan with the family. Opportunity was given to ask question and all questions were answered satisfactorily.   Disposition:  Discharge to home. Expected discharge date: 07/17/2016, stabilization of H&H as well as renal function  Consultants: phone Consultation with Dr. Justin Mend nephrology Procedures: none  Antibiotics: Anti-infectives    Start     Dose/Rate Route Frequency Ordered Stop   07/15/16 0600  azithromycin (ZITHROMAX) 500 mg in dextrose 5 % 250 mL IVPB     500 mg 250 mL/hr over 60 Minutes Intravenous Every 24 hours 07/14/16 0743 07/21/16 0559   07/15/16 0000  cefTRIAXone (ROCEPHIN) 1 g in dextrose 5 % 50 mL IVPB     1 g 100 mL/hr over 30 Minutes Intravenous Every 24 hours 07/14/16 0743 07/20/16 2359   07/14/16 0245  cefTRIAXone (ROCEPHIN) 1 g in dextrose 5 % 50 mL IVPB     1 g 100 mL/hr over 30 Minutes Intravenous  Once 07/14/16 0232 07/14/16 0325   07/14/16 0245  azithromycin (ZITHROMAX) 500 mg in dextrose 5 % 250 mL IVPB     500 mg 250 mL/hr over 60 Minutes Intravenous  Once 07/14/16 0232 07/14/16 0555        Subjective: Denies any acute complaint at present. Feeling better. No nausea no vomiting no diarrhea no shortness of breath or chest pain.  Objective: Physical Exam: Vitals:   07/15/16 8416 07/15/16 6063  07/15/16 1533 07/15/16 1600  BP: (!) 180/96 (!) 164/100 (!) 152/79 (!) 172/84  Pulse: 93 91 90 87  Resp: 16 18 17 16   Temp: 98.7 F (37.1 C) 98.6 F (37 C) 98.7 F (37.1 C) 98.4 F (36.9 C)  TempSrc: Oral Oral Oral Oral  SpO2: 96% 97% 93% 95%    Weight:      Height:        Intake/Output Summary (Last 24 hours) at 07/15/16 1826 Last data filed at 07/15/16 1700  Gross per 24 hour  Intake          4222.92 ml  Output             1925 ml  Net          2297.92 ml   Filed Weights   07/13/16 2113 07/13/16 2122 07/14/16 0359  Weight: 81.7 kg (180 lb 3.2 oz) 81.6 kg (180 lb) 80.2 kg (176 lb 12.8 oz)    General: Alert, Awake and Oriented to Time, Place and Person. Appear in mild distress, affect appropriate Eyes: PERRL, Conjunctiva normal ENT: Oral Mucosa clear moist. Neck: no JVD, no Abnormal Mass Or lumps Cardiovascular: S1 and S2 Present, no Murmur, Respiratory: Bilateral Air entry equal and Decreased, no use of accessory muscle, Clear to Auscultation, no Crackles, no wheezes Abdomen: Bowel Sound present, Soft and no tenderness Skin: no redness, no Rash, no induration Extremities: no Pedal edema, no calf tenderness Neurologic: Grossly no focal neuro deficit. Bilaterally Equal motor strength  Data Reviewed: CBC:  Recent Labs Lab 07/13/16 2132 07/14/16 0727 07/14/16 1542 07/14/16 2318 07/15/16 0347 07/15/16 0850  WBC 8.3 8.9 7.4 7.5 7.8  --   NEUTROABS 7.0 7.0  --   --   --  6.0  HGB 7.9* 7.3* 7.0* 6.6* 6.7*  --   HCT 22.9* 21.2* 20.7* 19.1* 19.1*  --   MCV 90.2 92.2 90.0 91.4 90.1  --   PLT 102* 84* 98* 82* 94*  --    Basic Metabolic Panel:  Recent Labs Lab 07/13/16 2132 07/14/16 0727 07/15/16 0850  NA 135 135 136  K 4.4 4.3 4.1  CL 104 104 107  CO2 27 23 23   GLUCOSE 121* 144* 160*  BUN 38* 42* 39*  CREATININE 3.14* 3.29* 3.15*  CALCIUM 8.7* 8.1* 8.4*    Liver Function Tests:  Recent Labs Lab 07/13/16 2132 07/15/16 0850  AST 40 37  ALT 31 28  ALKPHOS 115 89  BILITOT 1.2 0.7  PROT 5.8* 5.2*  ALBUMIN 3.0* 2.5*    Recent Labs Lab 07/14/16 0150  LIPASE 22   No results for input(s): AMMONIA in the last 168 hours. Coagulation Profile:  Recent Labs Lab 07/14/16 0937  INR 1.05    Cardiac Enzymes: No results for input(s): CKTOTAL, CKMB, CKMBINDEX, TROPONINI in the last 168 hours. BNP (last 3 results) No results for input(s): PROBNP in the last 8760 hours.  CBG:  Recent Labs Lab 07/15/16 0605 07/15/16 0730 07/15/16 0745 07/15/16 1124 07/15/16 1747  GLUCAP 125* 159* 161* 236* 105*    Studies: No results found.   Scheduled Meds: . azithromycin  500 mg Intravenous Q24H  . cefTRIAXone (ROCEPHIN)  IV  1 g Intravenous Q24H  . hydrALAZINE  50 mg Oral Q8H  . insulin aspart  0-5 Units Subcutaneous QHS  . insulin aspart  0-9 Units Subcutaneous TID WC  . isosorbide mononitrate  30 mg Oral Daily   Continuous Infusions: . sodium chloride 125 mL (07/14/16 1758)  PRN Meds: acetaminophen **OR** acetaminophen, hydrALAZINE, ipratropium-albuterol, metoprolol, promethazine  Time spent: 30 minutes  Author: Berle Mull, MD [facility] Pager: [phone] 07/15/2016 6:26 PM  If 7PM-7AM, please contact night-coverage at [email or web address], password [facility]

## 2016-07-16 LAB — COMPREHENSIVE METABOLIC PANEL
ALT: 24 U/L (ref 17–63)
AST: 31 U/L (ref 15–41)
Albumin: 2.4 g/dL — ABNORMAL LOW (ref 3.5–5.0)
Alkaline Phosphatase: 83 U/L (ref 38–126)
Anion gap: 8 (ref 5–15)
BUN: 34 mg/dL — ABNORMAL HIGH (ref 6–20)
CO2: 20 mmol/L — ABNORMAL LOW (ref 22–32)
Calcium: 8.3 mg/dL — ABNORMAL LOW (ref 8.9–10.3)
Chloride: 110 mmol/L (ref 101–111)
Creatinine, Ser: 2.94 mg/dL — ABNORMAL HIGH (ref 0.61–1.24)
GFR calc Af Amer: 28 mL/min — ABNORMAL LOW (ref >60)
GFR calc non Af Amer: 24 mL/min — ABNORMAL LOW (ref >60)
Glucose, Bld: 124 mg/dL — ABNORMAL HIGH (ref 65–99)
Potassium: 4.2 mmol/L (ref 3.5–5.1)
Sodium: 138 mmol/L (ref 135–145)
Total Bilirubin: 0.7 mg/dL (ref 0.3–1.2)
Total Protein: 4.8 g/dL — ABNORMAL LOW (ref 6.5–8.1)

## 2016-07-16 LAB — CBC WITH DIFFERENTIAL/PLATELET
BASOS PCT: 0 %
Basophils Absolute: 0 10*3/uL (ref 0.0–0.1)
EOS ABS: 0.3 10*3/uL (ref 0.0–0.7)
EOS PCT: 3 %
HCT: 21.7 % — ABNORMAL LOW (ref 39.0–52.0)
HEMOGLOBIN: 7.5 g/dL — AB (ref 13.0–17.0)
Lymphocytes Relative: 19 %
Lymphs Abs: 1.5 10*3/uL (ref 0.7–4.0)
MCH: 30.5 pg (ref 26.0–34.0)
MCHC: 34.6 g/dL (ref 30.0–36.0)
MCV: 88.2 fL (ref 78.0–100.0)
Monocytes Absolute: 0.7 10*3/uL (ref 0.1–1.0)
Monocytes Relative: 9 %
NEUTROS PCT: 69 %
Neutro Abs: 5.6 10*3/uL (ref 1.7–7.7)
PLATELETS: 123 10*3/uL — AB (ref 150–400)
RBC: 2.46 MIL/uL — AB (ref 4.22–5.81)
RDW: 13.2 % (ref 11.5–15.5)
WBC: 8.1 10*3/uL (ref 4.0–10.5)

## 2016-07-16 LAB — ANCA TITERS
Atypical P-ANCA titer: <1:20 {titer}
P-ANCA: <1:20 {titer}

## 2016-07-16 LAB — LEGIONELLA PNEUMOPHILA SEROGP 1 UR AG: L. PNEUMOPHILA SEROGP 1 UR AG: NEGATIVE

## 2016-07-16 LAB — T4, FREE: Free T4: 0.96 ng/dL (ref 0.61–1.12)

## 2016-07-16 LAB — PROTEIN ELECTROPHORESIS, SERUM
A/G Ratio: 1.2 (ref 0.7–1.7)
ALPHA-2-GLOBULIN: 0.4 g/dL (ref 0.4–1.0)
Albumin ELP: 2.5 g/dL — ABNORMAL LOW (ref 2.9–4.4)
Alpha-1-Globulin: 0.3 g/dL (ref 0.0–0.4)
BETA GLOBULIN: 0.8 g/dL (ref 0.7–1.3)
Gamma Globulin: 0.6 g/dL (ref 0.4–1.8)
Globulin, Total: 2.1 g/dL — ABNORMAL LOW (ref 2.2–3.9)
M-SPIKE, %: 0.2 g/dL — AB
Total Protein ELP: 4.6 g/dL — ABNORMAL LOW (ref 6.0–8.5)

## 2016-07-16 LAB — TYPE AND SCREEN
ABO/RH(D): O POS
ANTIBODY SCREEN: NEGATIVE
Unit division: 0

## 2016-07-16 LAB — GLUCOSE, CAPILLARY
GLUCOSE-CAPILLARY: 176 mg/dL — AB (ref 65–99)
GLUCOSE-CAPILLARY: 146 mg/dL — AB (ref 65–99)

## 2016-07-16 LAB — IRON AND TIBC
Iron: 32 ug/dL — ABNORMAL LOW (ref 45–182)
SATURATION RATIOS: 14 % — AB (ref 17.9–39.5)
TIBC: 237 ug/dL — AB (ref 250–450)
UIBC: 205 ug/dL

## 2016-07-16 LAB — PROCALCITONIN: Procalcitonin: 0.31 ng/mL

## 2016-07-16 LAB — MPO/PR-3 (ANCA) ANTIBODIES: Myeloperoxidase Abs: <9 U/mL (ref 0.0–9.0)

## 2016-07-16 LAB — ANTINUCLEAR ANTIBODIES, IFA: ANA Ab, IFA: NEGATIVE

## 2016-07-16 LAB — IMMUNOFIXATION, URINE

## 2016-07-16 MED ORDER — AMLODIPINE BESYLATE 5 MG PO TABS: 5.0000 mg | ORAL_TABLET | Freq: Every day | ORAL | 0 refills | Status: DC

## 2016-07-16 MED ORDER — FERROUS SULFATE 325 (65 FE) MG PO TBEC: 325.0000 mg | DELAYED_RELEASE_TABLET | Freq: Three times a day (TID) | ORAL | 0 refills | Status: DC

## 2016-07-16 MED ORDER — AMLODIPINE BESYLATE 5 MG PO TABS
5.0000 mg | ORAL_TABLET | Freq: Every day | ORAL | Status: DC
  Administered 2016-07-16: 5 mg via ORAL
  Filled 2016-07-16: qty 1

## 2016-07-16 MED ORDER — ISOSORBIDE MONONITRATE ER 30 MG PO TB24: 30.0000 mg | ORAL_TABLET | Freq: Every day | ORAL | 0 refills | Status: DC

## 2016-07-16 MED ORDER — LEVOFLOXACIN 750 MG PO TABS: 750.0000 mg | ORAL_TABLET | ORAL | 0 refills | Status: AC

## 2016-07-16 MED ORDER — HYDRALAZINE HCL 50 MG PO TABS: 50.0000 mg | ORAL_TABLET | Freq: Three times a day (TID) | ORAL | 0 refills | Status: DC

## 2016-07-16 MED ORDER — FOLIC ACID 1 MG PO TABS: 1.0000 mg | ORAL_TABLET | Freq: Every day | ORAL | 0 refills | Status: DC

## 2016-07-16 MED ORDER — LEVOFLOXACIN 750 MG PO TABS
750.0000 mg | ORAL_TABLET | ORAL | Status: DC
  Administered 2016-07-16: 750 mg via ORAL
  Filled 2016-07-16: qty 1

## 2016-07-16 NOTE — Progress Notes (Signed)
Patient left the unit without letting anyone know.  Patient asked to let staff know before leaving the unit.

## 2016-07-16 NOTE — Consult Note (Signed)
[facility] Consultation Note Requesting Physician:  Dr. Posey Pronto Primary Care: Dr. Delfina Redwood  Reason for Consult:  Renal  insufficiency  HPI: The patient is a 45 y.o. year-old with PMH significant for DM, HTN admitted 12/19 with a febrile illness associated with cough, N/V/D. CXR patchy basilar air space disease, resp panel + for coronavirus and for RSV. BC's neg to date.   Noted on admission to have a creatinine of 3.14 (0.8 2010, 1.19 06/2014 at Dr. Lina Sar office). Urinalysis showed >300 protein and large Hb by dipstick, 6-30 RBC's on microscopic.  UPC 4.64 grams. C3 and C4 normal, HIV neg. Ultrasound with 12,11 cm kidneys R/L respectively w/only minimally increased echotexture.  Noted to have anemia (6.6-7.5) and thrombocytopenia (currently 123K and slowly improving) but no schistocytes seen on peripheral smear. UDS + for THC.   Date/Time Value  07/16/2016 07:42 AM 2.94 (H)  07/15/2016 08:50 AM 3.15 (H)  07/14/2016 07:27 AM 3.29 (H)  07/13/2016 09:32 PM 3.14 (H)  06/29/2014 1.19  ([facility])  04/02/2009 12:14 PM 0.8   Pt tells me that when he was initially dx with DM in 2002 he was told he had proteinuria (at time of Dx A1C >10, BS>600- subsequently lost from 240 lb to current weight, has not taken metformin for over a year, normal A1C now). For the past 2 years or more has noted foaminess to the urine, no tea or coca cola colored urine. Has new onset (this admission) LE pitting edema. Follows with Dr. Delfina Redwood - last labs from his office dated 06/2014 creatinine 1.19. No UA data available.   Past Medical History:  Diagnosis Date  . Diabetes mellitus without complication ([place])   . Hypertension     Past Surgical History: History reviewed. No pertinent surgical history.   Family History  Problem Relation Age of Onset  . Diabetes Mother   . Diabetes Father    Social History:  reports that he has never smoked. His smokeless tobacco use includes Snuff. He reports that he uses drugs, including  Marijuana. He reports that he does not drink alcohol.  Allergies: No Known Allergies  Home medications: Prior to Admission medications   Medication Sig Start Date End Date Taking? Authorizing Provider  amLODipine (NORVASC) 5 MG tablet Take 1 tablet (5 mg total) by mouth daily. 07/17/16   Lavina Hamman, MD  hydrALAZINE (APRESOLINE) 50 MG tablet Take 1 tablet (50 mg total) by mouth 3 (three) times daily. 07/16/16   Lavina Hamman, MD  isosorbide mononitrate (IMDUR) 30 MG 24 hr tablet Take 1 tablet (30 mg total) by mouth daily. 07/17/16   Lavina Hamman, MD  levofloxacin (LEVAQUIN) 750 MG tablet Take 1 tablet (750 mg total) by mouth every other day. 07/16/16 07/21/16  Lavina Hamman, MD    Inpatient medications: . amLODipine  5 mg Oral Daily  . hydrALAZINE  50 mg Oral Q8H  . insulin aspart  0-5 Units Subcutaneous QHS  . insulin aspart  0-9 Units Subcutaneous TID WC  . isosorbide mononitrate  30 mg Oral Daily  . levofloxacin  750 mg Oral Q48H    Review of Systems Gen:  Denies headache, fever, chills, sweats.  Intentional weight loss from 240 lb to current weight of 179 since 2002 HEENT:  No visual change, sore throat, difficulty swallowing. Resp:  No difficulty breathing, DOE.  No cough or hemoptysis.currently denies SOB Cardiac:  No chest pain, orthopnea, PND.  New onset edema this admission GI:   Denies abdominal  pain.   Resolved diarrhea  No constipation. GU:  Denies difficulty or change in voiding.  No change in urine color. Sudsy urine for about 2 years, no tea or coca cola colored urine    MS:  Denies joint pain or swelling.   Derm:  Denies skin rash or itching.  No chronic skin conditions.  Neuro:   Denies focal weakness, memory problems, hx stroke or TIA.   Psych:  Denies symptoms of depression of anxiety.  No hallucination.    Physical Exam:  Blood pressure (!) 154/89, pulse 86, temperature 98 F (36.7 C), resp. rate 18, height 5\' 10"  (1.778 m), weight 81.5 kg (179 lb 10.8  oz), SpO2 100 %.  Gen: Very nice slender AAM Lines/tubes: None Skin: no rash, cyanosis Neck: no JVD, no bruits or LAN Chest: Coarse BS, scattered wheezes Heart: Prominent precordium, loud s4 1/6 mumrmu LSB Abdomen: soft, no focal tenderness Ext: 2+ pitting edema bilateral LE's Neuro: alert, Ox3, no focal deficit Heme/Lymph: no bruising or LAN   Recent Labs Lab 07/13/16 2132 07/14/16 0727 07/15/16 0850 07/16/16 0742  NA 135 135 136 138  K 4.4 4.3 4.1 4.2  CL 104 104 107 110  CO2 27 23 23  20*  GLUCOSE 121* 144* 160* 124*  BUN 38* 42* 39* 34*  CREATININE 3.14* 3.29* 3.15* 2.94*  CALCIUM 8.7* 8.1* 8.4* 8.3*     Recent Labs Lab 07/13/16 2132 07/15/16 0850 07/16/16 0742  AST 40 37 31  ALT 31 28 24   ALKPHOS 115 89 83  BILITOT 1.2 0.7 0.7  PROT 5.8* 5.2* 4.8*  ALBUMIN 3.0* 2.5* 2.4*    Recent Labs Lab 07/14/16 0150  LIPASE 22   Urinalysis    Component Value Date/Time   COLORURINE YELLOW 07/13/2016 2132   APPEARANCEUR CLOUDY (A) 07/13/2016 2132   LABSPEC 1.014 07/13/2016 2132   PHURINE 5.0 07/13/2016 2132   GLUCOSEU 150 (A) 07/13/2016 2132   HGBUR LARGE (A) 07/13/2016 2132   BILIRUBINUR NEGATIVE 07/13/2016 2132   KETONESUR NEGATIVE 07/13/2016 2132   PROTEINUR >=300 (A) 07/13/2016 2132   NITRITE NEGATIVE 07/13/2016 2132   LEUKOCYTESUR NEGATIVE 07/13/2016 2132   Micro 6-30 RBC's.    Recent Labs Lab 07/13/16 2132 07/14/16 0727  07/14/16 2318 07/15/16 0347 07/15/16 0850 07/15/16 2037 07/16/16 0742  WBC 8.3 8.9  < > 7.5 7.8  --  7.7 8.1  NEUTROABS 7.0 7.0  --   --   --  6.0  --  5.6  HGB 7.9* 7.3*  < > 6.6* 6.7*  --  7.0* 7.5*  HCT 22.9* 21.2*  < > 19.1* 19.1*  --  20.7* 21.7*  MCV 90.2 92.2  < > 91.4 90.1  --  89.2 88.2  PLT 102* 84*  < > 82* 94*  --  100* 123*  < > = values in this interval not displayed.   Recent Labs Lab 07/15/16 1124 07/15/16 1747 07/15/16 2143 07/16/16 0825 07/16/16 1131  GLUCAP 236* 105* 153* 176* 146*   Results  for Casey Preston, Casey Preston (MRN [id number]) as of 07/16/2016 13:17  Ref. Range 07/14/2016 06:30  Hemoglobin A1C Latest Ref Range: 4.8 - 5.6 % 5.2    Dg Chest 2 View  Result Date: 07/14/2016 CLINICAL DATA:  Cough and fever.  Hypoxia.  Several days duration. EXAM: CHEST  2 VIEW COMPARISON:  None FINDINGS: There are patchy airspace opacities in the lateral bases. This could represent pneumonia. No pleural effusion. Normal pulmonary vasculature. Unremarkable hilar and  mediastinal contours. IMPRESSION: Patchy airspace opacities in the bases. This could represent multifocal pneumonia. Followup PA and lateral chest X-ray is recommended in 3-4 weeks following trial of antibiotic therapy to ensure resolution and exclude underlying malignancy. Electronically Signed   By: Andreas Newport M.D.   On: 07/14/2016 02:16   US Renal  Result Date: 07/14/2016 CLINICAL DATA:  45 year old male with acute renal insufficiency and elevated BUN and creatinine. EXAM: RENAL / URINARY TRACT ULTRASOUND COMPLETE COMPARISON:  None. FINDINGS: Right Kidney: Length: 12 cm. Mild increased echotexture. No hydronephrosis or echogenic stone. Left Kidney: Length: 11 cm. Minimal atrophy. Possible minimal increased echotexture. No mass or hydronephrosis visualized. Bladder: Appears normal for degree of bladder distention. IMPRESSION: Minimally increased renal echotexture otherwise unremarkable ultrasound. Electronically Signed   By: Anner Crete M.D.   On: 07/14/2016 01:46   Background:  45 y.o. year-old with PMH significant for DM, HTN admitted 12/19 with a febrile illness associated with cough, N/V/D. CXR patchy basilar air space disease, resp panel + for coronavirus and for RSV. BC's neg to date. Noted on admission to have a creatinine of 3.14 (0.8 2010, 1.19 2015). Urinalysis showed >300 protein and large Hb by dipstick, 6-30 RBC's on microscopic.  UPC 4.64 grams. C3 and C4 normal, HIV neg. Ultrasound with 12,11 cm kidneys R/L  respectively w/only minimally increased echotexture.  Noted to have anemia (6.6-7.5) and thrombocytopenia (currently 123K and slowly improving) but no schistocytes seen on peripheral smear. UDS + for THC. Pt reports told of proteinuria in 2002, and has had foamy urine for about 2 years. Lasat creatinine in outpt setting was 06/2014 1.19 and no UA data available. We were asked to see.  Assessment/Recommendations  1. Renal insufficiency  - Creatinine elevated on admission - hovering around 3, GFR around 24, with blood and protein in urine (nephrotic range based on UPC). Last creatinine from [facility] care was 06/2014 and at that time was 1.19. Has background of diabetes and (probably) poorly controlled HTN (does not check at home) and although could have sig proteinuria associated with diabetic kidney disease, the hematuria is not typical.  Kidneys with only minimal increase in echogenicity and normal sizes not typical for hypertensive disease either.  Reports h/o proteinuria since 2002, and foamy urine past 2 years. 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  I think we need to throw out slightly broader net in terms of serologic evaluation and he will probably require renal biopsy for diagnosis and prognosis. I have spoken with him and if we can get the lab studies today, I can follow him up in the office after the first of the year. (Appt scheduled for 07/31/16 at 10:30 AM)   2. Febrile illness with patch MF infiltrates. + coronavirus and RSV. S/p rocephin and azithro, On levaquin presently and afebrile.  3. Anemia - ? If related to renal ds. No evidence of hemolysis. Check Fe studies. (IF we ultimately recommend renal biopsy will need better Hb cushion). (Hb was 13.6 in 2014) 4. Thrombocytopenia - appears to be slowly improving and perhaps related to acute infection. Plts in 2014 were 166K. 5. HTN - poorly  controlled. Improving w/meds in the hospital.  6. Disposition: I have ordered ANA, ANCA, antiGBM, Hep C Ab, iron studies and PTH.  Once those labs are done, he could be discharged to home when medically and I will see him in followup  in the office 07/31/16 at 10:30 AM  Jamal Maes, MD [facility] [phone] Pager 07/16/2016, 1:38 PM

## 2016-07-16 NOTE — Progress Notes (Signed)
Discharge instructions and medications discussed with patient.  All questions answered.  

## 2016-07-16 NOTE — Discharge Instructions (Signed)
Food Basics for Chronic Kidney Disease When your kidneys are not working well, they cannot remove waste and excess substances from your blood as effectively as they did before. This can lead to a buildup and imbalance of these substances, which can affect how your body functions. This buildup can also make your kidneys work harder, causing even more damage. You may need to eat less of certain foods that can lead to the buildup of these substances in your body. By making the changes to your diet that are recommended by your dietitian or health care provider, you could possibly help prevent further kidney damage and delay or prevent the need for dialysis. The following information can help give you a basic understanding of these substances and how they affect your bodily functions. The information also gives examples of foods that contain the highest amounts of these substances. WHAT DO I NEED TO KNOW ABOUT SUBSTANCES IN MY FOOD THAT I MAY NEED TO ADJUST? Food adjustments will be different for each person with chronic kidney disease. It is important that you see a dietitian who can help you determine the specific adjustments that you will need to make for each of the following substances: Potassium  Potassium affects how steadily your heart beats. If too much potassium builds up in your blood, it can cause an irregular heartbeat or even a heart attack. Examples of foods rich in potassium include:  Milk.  Fruits.  Vegetables. Phosphorus  Phosphorus is a mineral found in your bones. A balance between calcium and phosphorous is needed to build and maintain healthy bones. Too much phosphorus pulls calcium from your bones. This can make your bones weak and more likely to break. Too much phosphorus can also make your skin itch. Examples of foods rich in phosphorus include:  Milk and cheese.  Dried beans.  Peas.  Colas.  Nuts and peanut butter. Animal Protein  Animal protein helps you make and  keep muscle. It also helps in the repair of your body's cells and tissues. One of the natural breakdown products of protein is a waste product called urea. When your kidneys are not working properly, they cannot remove wastes such as urea like they did before you developed chronic kidney disease. You will likely need to limit the amount of protein you eat to help prevent a buildup of urea in your blood. Examples of animal protein include:  Meat (all types).  Fish and seafood.  Poultry.  Eggs. Sodium  Sodium, which is found in salt, helps maintain a healthy balance of fluids in your body. Too much sodium can increase your blood pressure level and have a negative affect on the function of your heart and lungs. Too much sodium also can cause your body to retain too much fluid, making your kidneys work harder. Examples of foods with high levels of sodium include:  Salt seasonings.  Soy sauce.  Cured and processed meats.  Salted crackers and snack foods.  Fast food.  Canned soups and most canned foods. Glucose  Glucose provides energy for your body. If you have diabetes mellitus that is not properly controlled, you have too much glucose in your blood. Too much glucose in your blood can worsen the function of your kidneys by damaging small blood vessels. This prevents enough blood flow to your kidneys to give them what they need to work. If you have diabetes mellitus and chronic kidney disease, it is important to maintain your blood glucose at a level recommended by your  health care provider. SHOULD I TAKE A VITAMIN AND MINERAL SUPPLEMENT? Because you may need to avoid eating certain foods, you may not get all of the vitamins and minerals that would normally come from those foods. Your health care provider or dietitian may recommend that you take a supplement to ensure that you get all of the vitamins and minerals that your body needs.  This information is not intended to replace advice given  to you by your health care provider. Make sure you discuss any questions you have with your health care provider. Document Released: 10/03/2002 Document Revised: 08/03/2014 Document Reviewed: 06/09/2013 Elsevier Interactive Patient Education  2017 [employer].

## 2016-07-17 LAB — ANTISTREPTOLYSIN O TITER: ASO: 27 [IU]/mL (ref 0.0–200.0)

## 2016-07-17 LAB — HCV COMMENT:

## 2016-07-17 LAB — GLOMERULAR BASEMENT MEMBRANE ANTIBODIES: GBM Ab: 3 units (ref 0–20)

## 2016-07-17 LAB — HEPATITIS C ANTIBODY (REFLEX): HCV Ab: <0.1 s/co ratio (ref 0.0–0.9)

## 2016-07-19 LAB — CULTURE, BLOOD (ROUTINE X 2)
CULTURE: NO GROWTH
Culture: NO GROWTH
Culture: NO GROWTH
Culture: NO GROWTH

## 2016-07-20 NOTE — Discharge Summary (Signed)
[facility] Discharge Summary   Patient: Casey Preston PPJ:[id number]   PCP: Kandice Hams, MD DOB: 03/13/1971   Date of admission: 07/14/2016   Date of discharge: 07/16/2016     Discharge Diagnoses:  Principal Problem:   Multifocal pneumonia Active Problems:   Diabetes mellitus type 2, diet-controlled ([place])   Essential hypertension   Anemia   Thrombocytopenia ([facility])   Acute renal failure ([place]) Chronic kidney disease stage III  Admitted From: Home Disposition:  Home  Recommendations for Outpatient Follow-up:  1. Follow-up with PCP in one week. 2. Establish care and follow-up with nephrology as recommended   Follow-up Information    POLITE,RONALD D, MD. Schedule an appointment as soon as possible for a visit in 1 week(s).   Specialty:  Internal Medicine Contact information: [address] [place] [address] [phone]        Lucrezia Starch, MD. Go on 07/31/2016.   Specialty:  Nephrology Why:  10:30 AM Contact information: [address] [place] [address] [phone]          Diet recommendation: Renal diet  Activity: The patient is advised to gradually reintroduce usual activities.  Discharge Condition: good  Code Status: Full code  History of present illness: As per the H and P dictated on admission, "Casey Preston is a 45 y.o. male with medical history significant of diabetes and hypertension presenting with generalized fatigue. Started 2 weeks ago but became significantly worse over the last 4 days ago. Associated with fever up to 102, intermittent productive cough, nausea, vomiting, mild abdominal discomfort and diarrhea. Multiple sick contacts at home recently. Denies using cigarettes but does use chewing tobacco. Patient also endorsing lower extremity swelling which is significantly worse on the right. Continues to make urine."  Hospital Course:   Summary of his active problems in the hospital is as following. 1.  Multifocal pneumonia Chest x-ray shows multifocal pneumonia. Along with acute kidney injury this finding could represent multiple situation, sepsis induced multiorgan damage, vasculitis syndrome involving kidney and lung. There was initial consideration off hemolytic syndrome but with normal fibrinogen level, normal INR and no schistocytes in the blood this is highly unlikely. Patient was given IV ceftriaxone and azithromycin. Discharged on oral Levaquin Negative blood cultures as well. RSV and corona virus positive.  2. Acute kidney injury. More likely chronic kidney disease stage III His findings appears likely chronic kidney disease in the setting of chronic noncompliance with medications for diabetes as well as hypertension. Ultrasound renal unremarkable. Patient is urinating more than a liter a day. Patient denies any urinary symptoms. Urinalysis is unremarkable for any concerning finding. Vasculitis panel has been sent. Further workup currently pending Appreciate consultation with nephrology recommends outpatient follow-up to establish care.  3. Essential hypertension. Antihypertensive medications added. Monitor.  4. History of diabetes. Hemoglobin A1c less than 6. Patient supposed to be on metformin as an outpatient but has not been taking on a regular basis.  5. Anemia. Likely anemia of chronic kidney disease worsening due to hemodilution Likely chronic in nature given the appearance of the peripheral smear as well as stabilization of the count. Hemoglobin less than 7 patient was transfuse 1 unit PRBC. FOBT negative no evidence of hemodialysis. Recommend to initiate iron supplementation   All other chronic medical condition were stable during the hospitalization.  Patient was ambulatory without any assistance. On the day of the discharge the patient's vitals were stable, and no other acute medical condition were reported  by patient. the patient was felt safe to be  discharge at home.  Procedures and Results:   Lower extremity venous Doppler Summary:  - No evidence of deep vein or superficial thrombosis involving the   right lower extremity and left lower extremity. - No evidence of Baker&'s cyst on the right or left.   Consultations:  Nephrology  DISCHARGE MEDICATION: Discharge Medication List as of 07/16/2016  4:00 PM    START taking these medications   Details  amLODipine (NORVASC) 5 MG tablet Take 1 tablet (5 mg total) by mouth daily., Starting Fri 07/17/2016, Normal    ferrous sulfate (FE TABS) 325 (65 FE) MG EC tablet Take 1 tablet (325 mg total) by mouth 3 (three) times daily with meals., Starting Thu 02/72/5366, Normal    folic acid (FOLVITE) 1 MG tablet Take 1 tablet (1 mg total) by mouth daily., Starting Thu 07/16/2016, Normal    hydrALAZINE (APRESOLINE) 50 MG tablet Take 1 tablet (50 mg total) by mouth 3 (three) times daily., Starting Thu 07/16/2016, Normal    isosorbide mononitrate (IMDUR) 30 MG 24 hr tablet Take 1 tablet (30 mg total) by mouth daily., Starting Fri 07/17/2016, Normal    levofloxacin (LEVAQUIN) 750 MG tablet Take 1 tablet (750 mg total) by mouth every other day., Starting Thu 07/16/2016, Until Tue 07/21/2016, Normal       No Known Allergies  Discharge Exam: Filed Weights   07/13/16 2122 07/14/16 0359 07/15/16 2120  Weight: 81.6 kg (180 lb) 80.2 kg (176 lb 12.8 oz) 81.5 kg (179 lb 10.8 oz)   Vitals:   07/16/16 0651 07/16/16 0835  BP: (!) 193/89 (!) 154/89  Pulse: 90 86  Resp:  18  Temp:  98 F (36.7 C)   General: Appear in no distress, no Rash; Oral Mucosa moist. Cardiovascular: S1 and S2 Present, no Murmur, no JVD Respiratory: Bilateral Air entry present and Clear to Auscultation, no Crackles, no wheezes Abdomen: Bowel Sound present, Soft and no tenderness Extremities: no Pedal edema, no calf tenderness Neurology: Grossly no focal neuro deficit.  The results of significant diagnostics from  this hospitalization (including imaging, microbiology, ancillary and laboratory) are listed below for reference.    Significant Diagnostic Studies: Dg Chest 2 View  Result Date: 07/14/2016 CLINICAL DATA:  Cough and fever.  Hypoxia.  Several days duration. EXAM: CHEST  2 VIEW COMPARISON:  None FINDINGS: There are patchy airspace opacities in the lateral bases. This could represent pneumonia. No pleural effusion. Normal pulmonary vasculature. Unremarkable hilar and mediastinal contours. IMPRESSION: Patchy airspace opacities in the bases. This could represent multifocal pneumonia. Followup PA and lateral chest X-ray is recommended in 3-4 weeks following trial of antibiotic therapy to ensure resolution and exclude underlying malignancy. Electronically Signed   By: Andreas Newport M.D.   On: 07/14/2016 02:16   US Renal  Result Date: 07/14/2016 CLINICAL DATA:  45 year old male with acute renal insufficiency and elevated BUN and creatinine. EXAM: RENAL / URINARY TRACT ULTRASOUND COMPLETE COMPARISON:  None. FINDINGS: Right Kidney: Length: 12 cm. Mild increased echotexture. No hydronephrosis or echogenic stone. Left Kidney: Length: 11 cm. Minimal atrophy. Possible minimal increased echotexture. No mass or hydronephrosis visualized. Bladder: Appears normal for degree of bladder distention. IMPRESSION: Minimally increased renal echotexture otherwise unremarkable ultrasound. Electronically Signed   By: Anner Crete M.D.   On: 07/14/2016 01:46    Microbiology: Recent Results (from the past 240 hour(s))  Blood culture (routine x 2)     Status: None  Collection Time: 07/14/16  1:50 AM  Result Value Ref Range Status   Specimen Description BLOOD RIGHT ARM  Final   Special Requests BOTTLES DRAWN AEROBIC AND ANAEROBIC 5CC EA  Final   Culture NO GROWTH 5 DAYS  Final   Report Status 07/19/2016 FINAL  Final  Blood culture (routine x 2)     Status: None   Collection Time: 07/14/16  1:57 AM  Result Value Ref  Range Status   Specimen Description BLOOD LEFT ARM  Final   Special Requests IN PEDIATRIC BOTTLE 2CC  Final   Culture NO GROWTH 5 DAYS  Final   Report Status 07/19/2016 FINAL  Final  Blood culture (routine x 2)     Status: None   Collection Time: 07/14/16  4:15 AM  Result Value Ref Range Status   Specimen Description BLOOD RIGHT ARM  Final   Special Requests BOTTLES DRAWN AEROBIC AND ANAEROBIC 5ML  Final   Culture NO GROWTH 5 DAYS  Final   Report Status 07/19/2016 FINAL  Final  Blood culture (routine x 2)     Status: None   Collection Time: 07/14/16  4:21 AM  Result Value Ref Range Status   Specimen Description BLOOD LEFT ARM  Final   Special Requests BOTTLES DRAWN AEROBIC AND ANAEROBIC 6ML  Final   Culture NO GROWTH 5 DAYS  Final   Report Status 07/19/2016 FINAL  Final  Respiratory Panel by PCR     Status: Abnormal   Collection Time: 07/14/16 11:19 AM  Result Value Ref Range Status   Adenovirus NOT DETECTED NOT DETECTED Final   Coronavirus 229E NOT DETECTED NOT DETECTED Final   Coronavirus HKU1 NOT DETECTED NOT DETECTED Final   Coronavirus NL63 NOT DETECTED NOT DETECTED Final   Coronavirus OC43 DETECTED (A) NOT DETECTED Final   Metapneumovirus NOT DETECTED NOT DETECTED Final   Rhinovirus / Enterovirus NOT DETECTED NOT DETECTED Final   Influenza A NOT DETECTED NOT DETECTED Final   Influenza B NOT DETECTED NOT DETECTED Final   Parainfluenza Virus 1 NOT DETECTED NOT DETECTED Final   Parainfluenza Virus 2 NOT DETECTED NOT DETECTED Final   Parainfluenza Virus 3 NOT DETECTED NOT DETECTED Final   Parainfluenza Virus 4 NOT DETECTED NOT DETECTED Final   Respiratory Syncytial Virus DETECTED (A) NOT DETECTED Final    Comment: CRITICAL RESULT CALLED TO, READ BACK BY AND VERIFIED WITH: Gordy Clement, RN AT 1405 ON 07/14/16 BY C. JESSUP, MLT.    Bordetella pertussis NOT DETECTED NOT DETECTED Final   Chlamydophila pneumoniae NOT DETECTED NOT DETECTED Final   Mycoplasma pneumoniae NOT DETECTED  NOT DETECTED Final     Labs: CBC:  Recent Labs Lab 07/13/16 2132 07/14/16 0727 07/14/16 1542 07/14/16 2318 07/15/16 0347 07/15/16 0850 07/15/16 2037 07/16/16 0742  WBC 8.3 8.9 7.4 7.5 7.8  --  7.7 8.1  NEUTROABS 7.0 7.0  --   --   --  6.0  --  5.6  HGB 7.9* 7.3* 7.0* 6.6* 6.7*  --  7.0* 7.5*  HCT 22.9* 21.2* 20.7* 19.1* 19.1*  --  20.7* 21.7*  MCV 90.2 92.2 90.0 91.4 90.1  --  89.2 88.2  PLT 102* 84* 98* 82* 94*  --  100* 267*   Basic Metabolic Panel:  Recent Labs Lab 07/13/16 2132 07/14/16 0727 07/15/16 0850 07/16/16 0742  NA 135 135 136 138  K 4.4 4.3 4.1 4.2  CL 104 104 107 110  CO2 27 23 23  20*  GLUCOSE 121* 144* 160*  124*  BUN 38* 42* 39* 34*  CREATININE 3.14* 3.29* 3.15* 2.94*  CALCIUM 8.7* 8.1* 8.4* 8.3*   Liver Function Tests:  Recent Labs Lab 07/13/16 2132 07/15/16 0850 07/16/16 0742  AST 40 37 31  ALT 31 28 24   ALKPHOS 115 89 83  BILITOT 1.2 0.7 0.7  PROT 5.8* 5.2* 4.8*  ALBUMIN 3.0* 2.5* 2.4*    Recent Labs Lab 07/14/16 0150  LIPASE 22   No results for input(s): AMMONIA in the last 168 hours. Cardiac Enzymes: No results for input(s): CKTOTAL, CKMB, CKMBINDEX, TROPONINI in the last 168 hours. BNP (last 3 results) No results for input(s): BNP in the last 8760 hours. CBG:  Recent Labs Lab 07/15/16 1124 07/15/16 1747 07/15/16 2143 07/16/16 0825 07/16/16 1131  GLUCAP 236* 105* 153* 176* 146*   Time spent: 30 minutes  Signed:  Bryceson Grape  [facility] 07/16/2016 , 8:46 AM

## 2016-07-28 LAB — PARATHYROID HORMONE, INTACT (NO CA): PTH: 101 pg/mL — AB (ref 15–65)

## 2016-08-10 ENCOUNTER — Other Ambulatory Visit (HOSPITAL_COMMUNITY): Payer: Self-pay | Admitting: *Deleted

## 2016-08-11 ENCOUNTER — Encounter (HOSPITAL_COMMUNITY)
Admission: RE | Admit: 2016-08-11 | Discharge: 2016-08-11 | Disposition: A | Discharge status: Home / Self Care | Payer: Medicaid Other | Source: Ambulatory Visit | Attending: Nephrology | Admitting: Nephrology

## 2016-08-11 DIAGNOSIS — D631 Anemia in chronic kidney disease: Secondary | ICD-10-CM | POA: Diagnosis not present

## 2016-08-11 DIAGNOSIS — N184 Chronic kidney disease, stage 4 (severe): Secondary | ICD-10-CM | POA: Diagnosis not present

## 2016-08-11 DIAGNOSIS — D509 Iron deficiency anemia, unspecified: Secondary | ICD-10-CM | POA: Diagnosis present

## 2016-08-11 LAB — POCT HEMOGLOBIN-HEMACUE: HEMOGLOBIN: 7.2 g/dL — AB (ref 13.0–17.0)

## 2016-08-11 MED ORDER — DARBEPOETIN ALFA 200 MCG/0.4ML IJ SOSY
PREFILLED_SYRINGE | INTRAMUSCULAR | Status: AC
  Filled 2016-08-11: qty 0.4

## 2016-08-11 MED ORDER — SODIUM CHLORIDE 0.9 % IV SOLN
510.0000 mg | Freq: Once | INTRAVENOUS | Status: AC
  Administered 2016-08-11: 13:00:00 510 mg via INTRAVENOUS
  Filled 2016-08-11: qty 17

## 2016-08-11 MED ORDER — DARBEPOETIN ALFA 200 MCG/0.4ML IJ SOSY
200.0000 ug | PREFILLED_SYRINGE | INTRAMUSCULAR | Status: DC
  Administered 2016-08-11: 13:00:00 200 ug via SUBCUTANEOUS

## 2016-08-11 NOTE — Progress Notes (Signed)
Pt came in today for scheduled Feraheme and aranesp.  His BP on arrival 196/109.  Pt stated that he did not take his BP medicine today.  His hgb 7.2.  It was his first appointment.  I called [facility] and talked to [place].  She called Dr Lorrene Reid.  We gave the patient his 200 of aranesp as ordered.  I instructed patient on the importance of taking his BP medication an hour prior to his next appointments.  Pt verbalized understanding of my instructions

## 2016-08-11 NOTE — Discharge Instructions (Signed)
Darbepoetin Alfa injection °What is this medicine? °DARBEPOETIN ALFA (dar be POE e tin AL fa) helps your body make more red blood cells. It is used to treat anemia caused by chronic kidney failure and chemotherapy. °This medicine may be used for other purposes; ask your health care provider or pharmacist if you have questions. °COMMON BRAND NAME(S): Aranesp °What should I tell my health care provider before I take this medicine? °They need to know if you have any of these conditions: °-blood clotting disorders or history of blood clots °-cancer patient not on chemotherapy °-cystic fibrosis °-heart disease, such as angina, heart failure, or a history of a heart attack °-hemoglobin level of 12 g/dL or greater °-high blood pressure °-low levels of folate, iron, or vitamin B12 °-seizures °-an unusual or allergic reaction to darbepoetin, erythropoietin, albumin, hamster proteins, latex, other medicines, foods, dyes, or preservatives °-pregnant or trying to get pregnant °-breast-feeding °How should I use this medicine? °This medicine is for injection into a vein or under the skin. It is usually given by a health care professional in a hospital or clinic setting. °If you get this medicine at home, you will be taught how to prepare and give this medicine. Use exactly as directed. Take your medicine at regular intervals. Do not take your medicine more often than directed. °It is important that you put your used needles and syringes in a special sharps container. Do not put them in a trash can. If you do not have a sharps container, call your pharmacist or healthcare provider to get one. °A special MedGuide will be given to you by the pharmacist with each prescription and refill. Be sure to read this information carefully each time. °Talk to your pediatrician regarding the use of this medicine in children. While this medicine may be used in children as young as 1 year for selected conditions, precautions do  apply. °Overdosage: If you think you have taken too much of this medicine contact a poison control center or emergency room at once. °NOTE: This medicine is only for you. Do not share this medicine with others. °What if I miss a dose? °If you miss a dose, take it as soon as you can. If it is almost time for your next dose, take only that dose. Do not take double or extra doses. °What may interact with this medicine? °Do not take this medicine with any of the following medications: °-epoetin alfa °This list may not describe all possible interactions. Give your health care provider a list of all the medicines, herbs, non-prescription drugs, or dietary supplements you use. Also tell them if you smoke, drink alcohol, or use illegal drugs. Some items may interact with your medicine. °What should I watch for while using this medicine? °Your condition will be monitored carefully while you are receiving this medicine. °You may need blood work done while you are taking this medicine. °What side effects may I notice from receiving this medicine? °Side effects that you should report to your doctor or health care professional as soon as possible: °-allergic reactions like skin rash, itching or hives, swelling of the face, lips, or tongue °-breathing problems °-changes in vision °-chest pain °-confusion, trouble speaking or understanding °-feeling faint or lightheaded, falls °-high blood pressure °-muscle aches or pains °-pain, swelling, warmth in the leg °-rapid weight gain °-severe headaches °-sudden numbness or weakness of the face, arm or leg °-trouble walking, dizziness, loss of balance or coordination °-seizures (convulsions) °-swelling of the ankles, feet, hands °-  unusually weak or tired Side effects that usually do not require medical attention (report to your doctor or health care professional if they continue or are bothersome): -diarrhea -fever, chills (flu-like symptoms) -headaches -nausea, vomiting -redness,  stinging, or swelling at site where injected This list may not describe all possible side effects. Call your doctor for medical advice about side effects. You may report side effects to FDA at 1-800-FDA-1088. Where should I keep my medicine? Keep out of the reach of children. Store in a refrigerator between 2 and 8 degrees C (36 and 46 degrees F). Do not freeze. Do not shake. Throw away any unused portion if using a single-dose vial. Throw away any unused medicine after the expiration date. NOTE: This sheet is a summary. It may not cover all possible information. If you have questions about this medicine, talk to your doctor, pharmacist, or health care provider.  2017 Elsevier/Gold Standard (2016-03-02 19:52:26) Ferumoxytol injection What is this medicine? FERUMOXYTOL is an iron complex. Iron is used to make healthy red blood cells, which carry oxygen and nutrients throughout the body. This medicine is used to treat iron deficiency anemia in people with chronic kidney disease. COMMON BRAND NAME(S): Feraheme What should I tell my health care provider before I take this medicine? They need to know if you have any of these conditions: -anemia not caused by low iron levels -high levels of iron in the blood -magnetic resonance imaging (MRI) test scheduled -an unusual or allergic reaction to iron, other medicines, foods, dyes, or preservatives -pregnant or trying to get pregnant -breast-feeding How should I use this medicine? This medicine is for injection into a vein. It is given by a health care professional in a hospital or clinic setting. Talk to your pediatrician regarding the use of this medicine in children. Special care may be needed. What if I miss a dose? It is important not to miss your dose. Call your doctor or health care professional if you are unable to keep an appointment. What may interact with this medicine? This medicine may interact with the following medications: -other iron  products What should I watch for while using this medicine? Visit your doctor or healthcare professional regularly. Tell your doctor or healthcare professional if your symptoms do not start to get better or if they get worse. You may need blood work done while you are taking this medicine. You may need to follow a special diet. Talk to your doctor. Foods that contain iron include: whole grains/cereals, dried fruits, beans, or peas, leafy green vegetables, and organ meats (liver, kidney). What side effects may I notice from receiving this medicine? Side effects that you should report to your doctor or health care professional as soon as possible: -allergic reactions like skin rash, itching or hives, swelling of the face, lips, or tongue -breathing problems -changes in blood pressure -feeling faint or lightheaded, falls -fever or chills -flushing, sweating, or hot feelings -swelling of the ankles or feet Side effects that usually do not require medical attention (report to your doctor or health care professional if they continue or are bothersome): -diarrhea -headache -nausea, vomiting -stomach pain Where should I keep my medicine? This drug is given in a hospital or clinic and will not be stored at home.  2017 Elsevier/Gold Standard (2015-08-15 12:41:49)

## 2016-08-25 ENCOUNTER — Encounter (HOSPITAL_COMMUNITY)
Admission: RE | Admit: 2016-08-25 | Discharge: 2016-08-25 | Disposition: A | Discharge status: Home / Self Care | Payer: Medicaid Other | Source: Ambulatory Visit | Attending: Nephrology | Admitting: Nephrology

## 2016-08-25 DIAGNOSIS — N184 Chronic kidney disease, stage 4 (severe): Secondary | ICD-10-CM | POA: Diagnosis not present

## 2016-08-25 DIAGNOSIS — D631 Anemia in chronic kidney disease: Secondary | ICD-10-CM

## 2016-08-25 MED ORDER — DARBEPOETIN ALFA 200 MCG/0.4ML IJ SOSY
PREFILLED_SYRINGE | INTRAMUSCULAR | Status: AC
  Administered 2016-08-25: 11:00:00 200 ug via SUBCUTANEOUS
  Filled 2016-08-25: qty 0.4

## 2016-08-25 MED ORDER — DARBEPOETIN ALFA 200 MCG/0.4ML IJ SOSY
200.0000 ug | PREFILLED_SYRINGE | INTRAMUSCULAR | Status: DC
  Administered 2016-08-25: 200 ug via SUBCUTANEOUS

## 2016-08-26 LAB — POCT HEMOGLOBIN-HEMACUE: Hemoglobin: 9.3 g/dL — ABNORMAL LOW (ref 13.0–17.0)

## 2016-09-08 ENCOUNTER — Encounter (HOSPITAL_COMMUNITY)
Admission: RE | Admit: 2016-09-08 | Discharge: 2016-09-08 | Disposition: A | Discharge status: Home / Self Care | Payer: Medicaid Other | Source: Ambulatory Visit | Attending: Nephrology | Admitting: Nephrology

## 2016-09-08 DIAGNOSIS — D631 Anemia in chronic kidney disease: Secondary | ICD-10-CM | POA: Diagnosis not present

## 2016-09-08 DIAGNOSIS — D509 Iron deficiency anemia, unspecified: Secondary | ICD-10-CM | POA: Diagnosis present

## 2016-09-08 DIAGNOSIS — N184 Chronic kidney disease, stage 4 (severe): Secondary | ICD-10-CM | POA: Insufficient documentation

## 2016-09-08 LAB — IRON AND TIBC
IRON: 14 ug/dL — AB (ref 45–182)
SATURATION RATIOS: 7 % — AB (ref 17.9–39.5)
TIBC: 207 ug/dL — ABNORMAL LOW (ref 250–450)
UIBC: 193 ug/dL

## 2016-09-08 LAB — FERRITIN: FERRITIN: 278 ng/mL (ref 24–336)

## 2016-09-08 LAB — POCT HEMOGLOBIN-HEMACUE: Hemoglobin: 10.3 g/dL — ABNORMAL LOW (ref 13.0–17.0)

## 2016-09-08 MED ORDER — DARBEPOETIN ALFA 200 MCG/0.4ML IJ SOSY
200.0000 ug | PREFILLED_SYRINGE | INTRAMUSCULAR | Status: DC
  Administered 2016-09-08: 200 ug via SUBCUTANEOUS

## 2016-09-08 MED ORDER — DARBEPOETIN ALFA 200 MCG/0.4ML IJ SOSY
PREFILLED_SYRINGE | INTRAMUSCULAR | Status: AC
  Filled 2016-09-08: qty 0.4

## 2016-09-22 ENCOUNTER — Encounter (HOSPITAL_COMMUNITY)
Admission: RE | Admit: 2016-09-22 | Discharge: 2016-09-22 | Disposition: A | Discharge status: Home / Self Care | Payer: Medicaid Other | Source: Ambulatory Visit | Attending: Nephrology | Admitting: Nephrology

## 2016-09-22 DIAGNOSIS — N184 Chronic kidney disease, stage 4 (severe): Secondary | ICD-10-CM | POA: Diagnosis not present

## 2016-09-22 DIAGNOSIS — D631 Anemia in chronic kidney disease: Secondary | ICD-10-CM

## 2016-09-22 LAB — POCT HEMOGLOBIN-HEMACUE: Hemoglobin: 9.5 g/dL — ABNORMAL LOW (ref 13.0–17.0)

## 2016-09-22 MED ORDER — DARBEPOETIN ALFA 200 MCG/0.4ML IJ SOSY
PREFILLED_SYRINGE | INTRAMUSCULAR | Status: AC
  Filled 2016-09-22: qty 0.4

## 2016-09-22 MED ORDER — DARBEPOETIN ALFA 200 MCG/0.4ML IJ SOSY
200.0000 ug | PREFILLED_SYRINGE | INTRAMUSCULAR | Status: DC
Start: 2016-09-22 — End: 2016-09-23
  Administered 2016-09-22: 200 ug via SUBCUTANEOUS

## 2016-10-06 ENCOUNTER — Encounter (HOSPITAL_COMMUNITY)
Admission: RE | Admit: 2016-10-06 | Discharge: 2016-10-06 | Disposition: A | Discharge status: Home / Self Care | Payer: Medicaid Other | Source: Ambulatory Visit | Attending: Nephrology | Admitting: Nephrology

## 2016-10-06 DIAGNOSIS — N184 Chronic kidney disease, stage 4 (severe): Secondary | ICD-10-CM | POA: Insufficient documentation

## 2016-10-06 DIAGNOSIS — D631 Anemia in chronic kidney disease: Secondary | ICD-10-CM | POA: Diagnosis not present

## 2016-10-06 DIAGNOSIS — D509 Iron deficiency anemia, unspecified: Secondary | ICD-10-CM | POA: Diagnosis present

## 2016-10-06 LAB — IRON AND TIBC
Iron: 28 ug/dL — ABNORMAL LOW (ref 45–182)
SATURATION RATIOS: 15 % — AB (ref 17.9–39.5)
TIBC: 182 ug/dL — AB (ref 250–450)
UIBC: 154 ug/dL

## 2016-10-06 LAB — FERRITIN: FERRITIN: 294 ng/mL (ref 24–336)

## 2016-10-06 LAB — POCT HEMOGLOBIN-HEMACUE: Hemoglobin: 10 g/dL — ABNORMAL LOW (ref 13.0–17.0)

## 2016-10-06 MED ORDER — DARBEPOETIN ALFA 200 MCG/0.4ML IJ SOSY
PREFILLED_SYRINGE | INTRAMUSCULAR | Status: AC
  Filled 2016-10-06: qty 0.4

## 2016-10-06 MED ORDER — DARBEPOETIN ALFA 200 MCG/0.4ML IJ SOSY
200.0000 ug | PREFILLED_SYRINGE | INTRAMUSCULAR | Status: DC
  Administered 2016-10-06: 10:00:00 200 ug via SUBCUTANEOUS

## 2016-10-20 ENCOUNTER — Encounter (HOSPITAL_COMMUNITY)
Admission: RE | Admit: 2016-10-20 | Discharge: 2016-10-20 | Disposition: A | Discharge status: Home / Self Care | Payer: Medicaid Other | Source: Ambulatory Visit | Attending: Nephrology | Admitting: Nephrology

## 2016-10-20 DIAGNOSIS — N184 Chronic kidney disease, stage 4 (severe): Secondary | ICD-10-CM | POA: Diagnosis not present

## 2016-10-20 DIAGNOSIS — D631 Anemia in chronic kidney disease: Secondary | ICD-10-CM

## 2016-10-20 LAB — POCT HEMOGLOBIN-HEMACUE: Hemoglobin: 11 g/dL — ABNORMAL LOW (ref 13.0–17.0)

## 2016-10-20 MED ORDER — DARBEPOETIN ALFA 200 MCG/0.4ML IJ SOSY
200.0000 ug | PREFILLED_SYRINGE | INTRAMUSCULAR | Status: DC
  Administered 2016-10-20: 10:00:00 200 ug via SUBCUTANEOUS

## 2016-10-20 MED ORDER — DARBEPOETIN ALFA 200 MCG/0.4ML IJ SOSY
PREFILLED_SYRINGE | INTRAMUSCULAR | Status: AC
  Filled 2016-10-20: qty 0.4

## 2016-11-03 ENCOUNTER — Encounter (HOSPITAL_COMMUNITY)
Admission: RE | Admit: 2016-11-03 | Discharge: 2016-11-03 | Disposition: A | Discharge status: Home / Self Care | Payer: Medicaid Other | Source: Ambulatory Visit | Attending: Nephrology | Admitting: Nephrology

## 2016-11-03 DIAGNOSIS — N184 Chronic kidney disease, stage 4 (severe): Secondary | ICD-10-CM | POA: Diagnosis not present

## 2016-11-03 DIAGNOSIS — D509 Iron deficiency anemia, unspecified: Secondary | ICD-10-CM | POA: Diagnosis present

## 2016-11-03 DIAGNOSIS — D631 Anemia in chronic kidney disease: Secondary | ICD-10-CM

## 2016-11-03 LAB — POCT HEMOGLOBIN-HEMACUE: Hemoglobin: 12.2 g/dL — ABNORMAL LOW (ref 13.0–17.0)

## 2016-11-03 LAB — IRON AND TIBC
Iron: 36 ug/dL — ABNORMAL LOW (ref 45–182)
SATURATION RATIOS: 16 % — AB (ref 17.9–39.5)
TIBC: 218 ug/dL — AB (ref 250–450)
UIBC: 182 ug/dL

## 2016-11-03 LAB — FERRITIN: Ferritin: 193 ng/mL (ref 24–336)

## 2016-11-03 MED ORDER — DARBEPOETIN ALFA 200 MCG/0.4ML IJ SOSY
PREFILLED_SYRINGE | INTRAMUSCULAR | Status: AC
  Filled 2016-11-03: qty 0.4

## 2016-11-03 MED ORDER — DARBEPOETIN ALFA 200 MCG/0.4ML IJ SOSY: 200.0000 ug | PREFILLED_SYRINGE | INTRAMUSCULAR | Status: DC

## 2016-11-06 ENCOUNTER — Other Ambulatory Visit (HOSPITAL_COMMUNITY): Payer: Self-pay | Admitting: Nephrology

## 2016-11-06 DIAGNOSIS — I10 Essential (primary) hypertension: Secondary | ICD-10-CM

## 2016-11-06 DIAGNOSIS — N184 Chronic kidney disease, stage 4 (severe): Secondary | ICD-10-CM

## 2016-11-11 ENCOUNTER — Other Ambulatory Visit: Payer: Self-pay | Admitting: Physician Assistant

## 2016-11-12 ENCOUNTER — Ambulatory Visit (HOSPITAL_COMMUNITY)
Admission: RE | Admit: 2016-11-12 | Discharge: 2016-11-12 | Disposition: A | Discharge status: Home / Self Care | Payer: Medicaid Other | Source: Ambulatory Visit | Attending: Nephrology | Admitting: Nephrology

## 2016-11-12 DIAGNOSIS — D631 Anemia in chronic kidney disease: Secondary | ICD-10-CM | POA: Diagnosis present

## 2016-11-12 DIAGNOSIS — E1122 Type 2 diabetes mellitus with diabetic chronic kidney disease: Secondary | ICD-10-CM | POA: Insufficient documentation

## 2016-11-12 DIAGNOSIS — I129 Hypertensive chronic kidney disease with stage 1 through stage 4 chronic kidney disease, or unspecified chronic kidney disease: Secondary | ICD-10-CM | POA: Insufficient documentation

## 2016-11-12 DIAGNOSIS — Z79899 Other long term (current) drug therapy: Secondary | ICD-10-CM | POA: Insufficient documentation

## 2016-11-12 DIAGNOSIS — I10 Essential (primary) hypertension: Secondary | ICD-10-CM

## 2016-11-12 DIAGNOSIS — N184 Chronic kidney disease, stage 4 (severe): Secondary | ICD-10-CM | POA: Insufficient documentation

## 2016-11-12 LAB — CBC
HEMATOCRIT: 30.8 % — AB (ref 39.0–52.0)
HEMOGLOBIN: 10.1 g/dL — AB (ref 13.0–17.0)
MCH: 29.3 pg (ref 26.0–34.0)
MCHC: 32.8 g/dL (ref 30.0–36.0)
MCV: 89.3 fL (ref 78.0–100.0)
Platelets: 150 10*3/uL (ref 150–400)
RBC: 3.45 MIL/uL — AB (ref 4.22–5.81)
RDW: 15.1 % (ref 11.5–15.5)
WBC: 6.3 10*3/uL (ref 4.0–10.5)

## 2016-11-12 LAB — APTT: aPTT: 27 seconds (ref 24–36)

## 2016-11-12 LAB — PROTIME-INR
INR: 0.94
Prothrombin Time: 12.6 seconds (ref 11.4–15.2)

## 2016-11-12 LAB — BASIC METABOLIC PANEL
ANION GAP: 11 (ref 5–15)
BUN: 49 mg/dL — ABNORMAL HIGH (ref 6–20)
CHLORIDE: 101 mmol/L (ref 101–111)
CO2: 25 mmol/L (ref 22–32)
Calcium: 8.3 mg/dL — ABNORMAL LOW (ref 8.9–10.3)
Creatinine, Ser: 4.88 mg/dL — ABNORMAL HIGH (ref 0.61–1.24)
GFR calc non Af Amer: 13 mL/min — ABNORMAL LOW (ref >60)
GFR, EST AFRICAN AMERICAN: 15 mL/min — AB (ref >60)
Glucose, Bld: 258 mg/dL — ABNORMAL HIGH (ref 65–99)
POTASSIUM: 4.4 mmol/L (ref 3.5–5.1)
SODIUM: 137 mmol/L (ref 135–145)

## 2016-11-12 MED ORDER — FENTANYL CITRATE (PF) 100 MCG/2ML IJ SOLN
INTRAMUSCULAR | Status: AC
  Filled 2016-11-12: qty 2

## 2016-11-12 MED ORDER — MIDAZOLAM HCL 2 MG/2ML IJ SOLN
INTRAMUSCULAR | Status: AC | PRN
  Administered 2016-11-12: 1 mg via INTRAVENOUS

## 2016-11-12 MED ORDER — SODIUM CHLORIDE 0.9 % IV SOLN: INTRAVENOUS | Status: DC

## 2016-11-12 MED ORDER — CLONIDINE HCL 0.2 MG PO TABS
0.2000 mg | ORAL_TABLET | Freq: Once | ORAL | Status: AC
  Administered 2016-11-12: 0.2 mg via ORAL
  Filled 2016-11-12: qty 1

## 2016-11-12 MED ORDER — FENTANYL CITRATE (PF) 100 MCG/2ML IJ SOLN
INTRAMUSCULAR | Status: AC | PRN
  Administered 2016-11-12: 50 ug via INTRAVENOUS

## 2016-11-12 MED ORDER — MIDAZOLAM HCL 2 MG/2ML IJ SOLN
INTRAMUSCULAR | Status: AC
  Filled 2016-11-12: qty 2

## 2016-11-12 MED ORDER — LIDOCAINE HCL 1 % IJ SOLN
INTRAMUSCULAR | Status: AC
  Filled 2016-11-12: qty 20

## 2016-11-12 NOTE — Procedures (Signed)
Renal Cortex Core Bx 16 g times two EBL 0 Comp 0

## 2016-11-12 NOTE — Progress Notes (Signed)
Dr. Barbie Banner notified about patients oozing on dressing site and blood pressures of 217'W to 375'G systolic and diastolic.  Notified of wife having a concern of blood pressure and dressing site.  New dressing placed per DR. Hoss request and patient discharged with no complaints in stable condition.

## 2016-11-12 NOTE — H&P (Signed)
Chief Complaint: Patient was seen in consultation today for random renal biopsy  Referring Physician(s):  Dunham,Cynthia  Supervising Physician: Marybelle Killings  Patient Status: [facility] - Out-pt  History of Present Illness: Casey Preston is a 46 y.o. male with past medical history of HTN and well-controlled DM2 who presents with persistently elevated SCr.  IR consulted for random renal biopsy at the request of Dr. Jamal Maes.   Patient has been NPO.  He does not take blood thinners.  His blood pressure is high today despite taking home BP medication. He has otherwise been in his usual state of health.   Past Medical History:  Diagnosis Date  . Diabetes mellitus without complication ([place])   . Hypertension     No past surgical history on file.  Allergies: Patient has no known allergies.  Medications: Prior to Admission medications   Medication Sig Start Date End Date Taking? Authorizing Provider  amLODipine (NORVASC) 5 MG tablet Take 1 tablet (5 mg total) by mouth daily. Patient taking differently: Take 5 mg by mouth every evening.  07/17/16  Yes Lavina Hamman, MD  ferrous sulfate (FE TABS) 325 (65 FE) MG EC tablet Take 1 tablet (325 mg total) by mouth 3 (three) times daily with meals. 07/16/16  Yes Lavina Hamman, MD  furosemide (LASIX) 80 MG tablet Take 160 mg by mouth 2 (two) times daily.   Yes Historical Provider, MD  hydrALAZINE (APRESOLINE) 50 MG tablet Take 1 tablet (50 mg total) by mouth 3 (three) times daily. 07/16/16  Yes Lavina Hamman, MD  isosorbide mononitrate (IMDUR) 30 MG 24 hr tablet Take 1 tablet (30 mg total) by mouth daily. 07/17/16  Yes Lavina Hamman, MD  folic acid (FOLVITE) 1 MG tablet Take 1 tablet (1 mg total) by mouth daily. 07/16/16   Lavina Hamman, MD     Family History  Problem Relation Age of Onset  . Diabetes Mother   . Diabetes Father     Social History   Social History  . Marital status: Married    Spouse name: N/A  . Number  of children: N/A  . Years of education: N/A   Social History Main Topics  . Smoking status: Never Smoker  . Smokeless tobacco: Current User    Types: Snuff  . Alcohol use No  . Drug use: Yes    Types: Marijuana  . Sexual activity: Not on file   Other Topics Concern  . Not on file   Social History Narrative  . No narrative on file    Review of Systems  Constitutional: Negative for fatigue and fever.  Respiratory: Negative for cough and shortness of breath.   Cardiovascular: Negative for chest pain.  Psychiatric/Behavioral: Negative for behavioral problems and confusion.    Vital Signs: BP (!) 198/109   Pulse 88   Temp 97.7 F (36.5 C) (Oral)   Resp 16   Ht 5' 9.5" (1.765 m)   Wt 215 lb (97.5 kg)   SpO2 96%   BMI 31.29 kg/m   Physical Exam  Constitutional: He is oriented to person, place, and time. He appears well-developed.  Cardiovascular: Normal rate, regular rhythm and normal heart sounds.   Pulmonary/Chest: Effort normal and breath sounds normal. No respiratory distress.  Neurological: He is alert and oriented to person, place, and time.  Skin: Skin is warm and dry.  Psychiatric: He has a normal mood and affect. His behavior is normal. Judgment and thought content  normal.  Nursing note and vitals reviewed.   Mallampati Score:  MD Evaluation Airway: WNL Heart: WNL Abdomen: WNL Chest/ Lungs: WNL ASA  Classification: 3 Mallampati/Airway Score: Two  Imaging: No results found.  Labs:  CBC:  Recent Labs  07/15/16 0347 07/15/16 2037 07/16/16 0742  10/06/16 0909 10/20/16 0953 11/03/16 1121 11/12/16 0601  WBC 7.8 7.7 8.1  --   --   --   --  6.3  HGB 6.7* 7.0* 7.5*  < > 10.0* 11.0* 12.2* 10.1*  HCT 19.1* 20.7* 21.7*  --   --   --   --  30.8*  PLT 94* 100* 123*  --   --   --   --  150  < > = values in this interval not displayed.  COAGS:  Recent Labs  07/14/16 0937 11/12/16 0601  INR 1.05 0.94  APTT 44* 27    BMP:  Recent Labs   07/14/16 0727 07/15/16 0850 07/16/16 0742 11/12/16 0601  NA 135 136 138 137  K 4.3 4.1 4.2 4.4  CL 104 107 110 101  CO2 23 23 20* 25  GLUCOSE 144* 160* 124* 258*  BUN 42* 39* 34* 49*  CALCIUM 8.1* 8.4* 8.3* 8.3*  CREATININE 3.29* 3.15* 2.94* 4.88*  GFRNONAA 21* 22* 24* 13*  GFRAA 25* 26* 28* 15*    LIVER FUNCTION TESTS:  Recent Labs  07/13/16 2132 07/15/16 0850 07/16/16 0742  BILITOT 1.2 0.7 0.7  AST 40 37 31  ALT 31 28 24   ALKPHOS 115 89 83  PROT 5.8* 5.2* 4.8*  ALBUMIN 3.0* 2.5* 2.4*    TUMOR MARKERS: No results for input(s): AFPTM, CEA, CA199, CHROMGRNA in the last 8760 hours.  Assessment and Plan: Patient with history of HTN and DM now with chronic kidney disease and elevate SCr presents for random renal biopsy at the request of Dr. Jamal Maes.  Patient presents for procedure today.  He has been NPO.  He does not take blood thinners.  His BP is elevated which is his baseline, however greatly increases his risk for procedure. Risks and benefits discussed with the patient including, but not limited to bleeding, infection, damage to adjacent structures or low yield requiring additional tests. All of the patient's questions were answered, patient is agreeable to proceed. Consent signed and in chart. Will contact Dr. Sanda Klein office for instructions for better BP control prior to preceeding.  Thank you for this interesting consult.  I greatly enjoyed meeting Casey Preston and look forward to participating in their care.  A copy of this report was sent to the requesting provider on this date.  Electronically Signed: Docia Barrier 11/12/2016, 7:56 AM   I spent a total of  30 Minutes   in face to face in clinical consultation, greater than 50% of which was counseling/coordinating care for chronic kidney disease.

## 2016-11-12 NOTE — Discharge Instructions (Signed)
Percutaneous Kidney Biopsy, Care After °This sheet gives you information about how to care for yourself after your procedure. Your health care provider may also give you more specific instructions. If you have problems or questions, contact your health care provider. °What can I expect after the procedure? °After the procedure, it is common to have: °· Pain or soreness near the area where the needle went through your skin (biopsy site). °· Bright pink or cloudy urine for 24 hours after the procedure. °Follow these instructions at home: °Activity  °· Return to your normal activities as told by your health care provider. Ask your health care provider what activities are safe for you. °· Do not drive for 24 hours if you were given a medicine to help you relax (sedative). °· Do not lift anything that is heavier than 10 lb (4.5 kg) until your health care provider tells you that it is safe. °· Avoid activities that take a lot of effort (are strenuous) until your health care provider approves. Most people will have to wait 2 weeks before returning to activities such as exercise or sexual intercourse. °General instructions  °· Take over-the-counter and prescription medicines only as told by your health care provider. °· You may eat and drink after your procedure. Follow instructions from your health care provider about eating or drinking restrictions. °· Check your biopsy site every day for signs of infection. Check for: °¨ More redness, swelling, or pain. °¨ More fluid or blood. °¨ Warmth. °¨ Pus or a bad smell. °· Keep all follow-up visits as told by your health care provider. This is important. °Contact a health care provider if: °· You have more redness, swelling, or pain around your biopsy site. °· You have more fluid or blood coming from your biopsy site. °· Your biopsy site feels warm to the touch. °· You have pus or a bad smell coming from your biopsy site. °· You have blood in your urine more than 24 hours after  your procedure. °Get help right away if: °· You have dark red or brown urine. °· You have a fever. °· You are unable to urinate. °· You feel burning when you urinate. °· You feel faint. °· You have severe pain in your abdomen or side. °This information is not intended to replace advice given to you by your health care provider. Make sure you discuss any questions you have with your health care provider. °Document Released: 03/15/2013 Document Revised: 04/24/2016 Document Reviewed: 04/24/2016 °Elsevier Interactive Patient Education © 2017 [employer] ° °

## 2016-11-16 ENCOUNTER — Other Ambulatory Visit (HOSPITAL_COMMUNITY): Payer: Self-pay | Admitting: *Deleted

## 2016-11-17 ENCOUNTER — Inpatient Hospital Stay (HOSPITAL_COMMUNITY): Admission: RE | Admit: 2016-11-17 | Payer: Medicaid Other | Source: Ambulatory Visit

## 2016-11-17 ENCOUNTER — Other Ambulatory Visit: Payer: Self-pay | Admitting: *Deleted

## 2016-11-17 DIAGNOSIS — Z01810 Encounter for preprocedural cardiovascular examination: Secondary | ICD-10-CM

## 2016-11-17 DIAGNOSIS — N185 Chronic kidney disease, stage 5: Secondary | ICD-10-CM

## 2016-11-18 ENCOUNTER — Encounter: Payer: Self-pay | Admitting: Surgery

## 2016-11-19 ENCOUNTER — Encounter (HOSPITAL_COMMUNITY): Payer: Self-pay

## 2016-11-24 ENCOUNTER — Ambulatory Visit (HOSPITAL_COMMUNITY): Payer: Medicaid Other

## 2016-11-24 ENCOUNTER — Encounter (HOSPITAL_COMMUNITY): Payer: Self-pay

## 2016-11-25 ENCOUNTER — Encounter (HOSPITAL_COMMUNITY): Payer: Self-pay | Admitting: *Deleted

## 2016-11-25 NOTE — Progress Notes (Signed)
Pt denies SOB, chest pain, and being under the care of a cardiologist. Pt denies having a stress test, echo and cardiac cath. Pt requested that spouse is given pre-op instructions. Spouse made aware to have pt stop taking  Aspirin, vitamins, fish oil and herbal medications. Do not take any NSAIDs ie: Ibuprofen, Advil, Naproxen, BC and Goody Powder or any medication containing Aspirin. Spouse stated that pt does not check BG or take diabetes medications . Spouse stated that pt had labs at [facility] on 5/1 and an A1c at [facility] last month; records requested. Spouse verbalized understanding of all pre-op instructions.

## 2016-11-26 ENCOUNTER — Ambulatory Visit: Payer: Self-pay | Admitting: Ophthalmology

## 2016-11-26 ENCOUNTER — Encounter (HOSPITAL_COMMUNITY)
Admission: RE | Disposition: A | Discharge status: Home / Self Care | Payer: Self-pay | Source: Ambulatory Visit | Attending: Ophthalmology

## 2016-11-26 ENCOUNTER — Ambulatory Visit (HOSPITAL_COMMUNITY): Payer: Medicaid Other | Admitting: Anesthesiology

## 2016-11-26 ENCOUNTER — Ambulatory Visit (HOSPITAL_COMMUNITY)
Admission: RE | Admit: 2016-11-26 | Discharge: 2016-11-26 | Disposition: A | Discharge status: Home / Self Care | Payer: Medicaid Other | Source: Ambulatory Visit | Attending: Ophthalmology | Admitting: Ophthalmology

## 2016-11-26 DIAGNOSIS — E1122 Type 2 diabetes mellitus with diabetic chronic kidney disease: Secondary | ICD-10-CM | POA: Insufficient documentation

## 2016-11-26 DIAGNOSIS — E113531 Type 2 diabetes mellitus with proliferative diabetic retinopathy with traction retinal detachment not involving the macula, right eye: Secondary | ICD-10-CM | POA: Insufficient documentation

## 2016-11-26 DIAGNOSIS — Z833 Family history of diabetes mellitus: Secondary | ICD-10-CM | POA: Insufficient documentation

## 2016-11-26 DIAGNOSIS — N184 Chronic kidney disease, stage 4 (severe): Secondary | ICD-10-CM | POA: Diagnosis not present

## 2016-11-26 DIAGNOSIS — I129 Hypertensive chronic kidney disease with stage 1 through stage 4 chronic kidney disease, or unspecified chronic kidney disease: Secondary | ICD-10-CM | POA: Insufficient documentation

## 2016-11-26 DIAGNOSIS — H3341 Traction detachment of retina, right eye: Secondary | ICD-10-CM | POA: Diagnosis present

## 2016-11-26 HISTORY — DX: Pneumonia, unspecified organism: J18.9

## 2016-11-26 HISTORY — PX: REPAIR OF COMPLEX TRACTION RETINAL DETACHMENT: SHX6217

## 2016-11-26 HISTORY — DX: Anemia, unspecified: D64.9

## 2016-11-26 HISTORY — DX: Serous retinal detachment, right eye: H33.21

## 2016-11-26 LAB — GLUCOSE, CAPILLARY
GLUCOSE-CAPILLARY: 181 mg/dL — AB (ref 65–99)
GLUCOSE-CAPILLARY: 138 mg/dL — AB (ref 65–99)
Glucose-Capillary: 252 mg/dL — ABNORMAL HIGH (ref 65–99)

## 2016-11-26 LAB — POCT I-STAT, CHEM 8
BUN: 69 mg/dL — AB (ref 6–20)
CALCIUM ION: 1.07 mmol/L — AB (ref 1.15–1.40)
CHLORIDE: 98 mmol/L — AB (ref 101–111)
Creatinine, Ser: 5.6 mg/dL — ABNORMAL HIGH (ref 0.61–1.24)
Glucose, Bld: 221 mg/dL — ABNORMAL HIGH (ref 65–99)
HEMATOCRIT: 28 % — AB (ref 39.0–52.0)
Hemoglobin: 9.5 g/dL — ABNORMAL LOW (ref 13.0–17.0)
Potassium: 3.8 mmol/L (ref 3.5–5.1)
SODIUM: 139 mmol/L (ref 135–145)
TCO2: 31 mmol/L (ref 0–100)

## 2016-11-26 SURGERY — REPAIR, RETINAL DETACHMENT, COMPLEX
Anesthesia: Monitor Anesthesia Care | Site: Eye | Laterality: Right

## 2016-11-26 MED ORDER — MIDAZOLAM HCL 2 MG/2ML IJ SOLN
INTRAMUSCULAR | Status: AC
  Filled 2016-11-26: qty 2

## 2016-11-26 MED ORDER — ONDANSETRON HCL 4 MG/2ML IJ SOLN: 4.0000 mg | Freq: Once | INTRAMUSCULAR | Status: DC | PRN

## 2016-11-26 MED ORDER — TRIAMCINOLONE ACETONIDE 40 MG/ML IJ SUSP
INTRAMUSCULAR | Status: AC
  Filled 2016-11-26: qty 5

## 2016-11-26 MED ORDER — PROPOFOL 10 MG/ML IV BOLUS
INTRAVENOUS | Status: AC
  Filled 2016-11-26: qty 20

## 2016-11-26 MED ORDER — PROPOFOL 10 MG/ML IV BOLUS
INTRAVENOUS | Status: DC | PRN
  Administered 2016-11-26: 10 mg via INTRAVENOUS
  Administered 2016-11-26: 40 mg via INTRAVENOUS

## 2016-11-26 MED ORDER — TROPICAMIDE 1 % OP SOLN
OPHTHALMIC | Status: AC
  Filled 2016-11-26: qty 15

## 2016-11-26 MED ORDER — CEFAZOLIN SUBCONJUNCTIVAL INJECTION 100 MG/0.5 ML
200.0000 mg | INJECTION | SUBCONJUNCTIVAL | Status: AC
  Administered 2016-11-26: 200 mg via SUBCONJUNCTIVAL
  Filled 2016-11-26: qty 5

## 2016-11-26 MED ORDER — FENTANYL CITRATE (PF) 100 MCG/2ML IJ SOLN
INTRAMUSCULAR | Status: DC | PRN
  Administered 2016-11-26: 50 ug via INTRAVENOUS

## 2016-11-26 MED ORDER — ATROPINE SULFATE 1 % OP SOLN
OPHTHALMIC | Status: AC
  Filled 2016-11-26: qty 5

## 2016-11-26 MED ORDER — POLYMYXIN B SULFATE 500000 UNITS IJ SOLR
INTRAMUSCULAR | Status: AC
  Filled 2016-11-26: qty 500000

## 2016-11-26 MED ORDER — HYPROMELLOSE (GONIOSCOPIC) 2.5 % OP SOLN
OPHTHALMIC | Status: DC | PRN
  Administered 2016-11-26: 2 [drp]

## 2016-11-26 MED ORDER — SODIUM CHLORIDE 0.9 % IV SOLN
INTRAVENOUS | Status: DC
  Administered 2016-11-26 (×2): via INTRAVENOUS

## 2016-11-26 MED ORDER — DEXAMETHASONE SODIUM PHOSPHATE 10 MG/ML IJ SOLN
INTRAMUSCULAR | Status: AC
  Filled 2016-11-26: qty 1

## 2016-11-26 MED ORDER — LIDOCAINE HCL (CARDIAC) 20 MG/ML IV SOLN
INTRAVENOUS | Status: DC | PRN
  Administered 2016-11-26: 60 mg via INTRAVENOUS

## 2016-11-26 MED ORDER — GENTAMICIN SULFATE 40 MG/ML IJ SOLN
INTRAMUSCULAR | Status: AC
  Filled 2016-11-26: qty 2

## 2016-11-26 MED ORDER — OFLOXACIN 0.3 % OP SOLN
OPHTHALMIC | Status: AC
  Filled 2016-11-26: qty 5

## 2016-11-26 MED ORDER — EPINEPHRINE PF 1 MG/ML IJ SOLN
INTRAMUSCULAR | Status: AC
  Filled 2016-11-26: qty 1

## 2016-11-26 MED ORDER — PHENYLEPHRINE HCL 2.5 % OP SOLN
OPHTHALMIC | Status: AC
Start: 2016-11-26 — End: 2016-11-26
  Administered 2016-11-26: 11:00:00
  Filled 2016-11-26: qty 2

## 2016-11-26 MED ORDER — SODIUM CHLORIDE 0.9 % IJ SOLN
INTRAMUSCULAR | Status: AC
  Filled 2016-11-26: qty 10

## 2016-11-26 MED ORDER — ONDANSETRON HCL 4 MG/2ML IJ SOLN
INTRAMUSCULAR | Status: DC | PRN
  Administered 2016-11-26: 4 mg via INTRAVENOUS

## 2016-11-26 MED ORDER — FENTANYL CITRATE (PF) 250 MCG/5ML IJ SOLN
INTRAMUSCULAR | Status: AC
  Filled 2016-11-26: qty 5

## 2016-11-26 MED ORDER — FENTANYL CITRATE (PF) 100 MCG/2ML IJ SOLN: 25.0000 ug | INTRAMUSCULAR | Status: DC | PRN

## 2016-11-26 MED ORDER — BSS PLUS IO SOLN
INTRAOCULAR | Status: AC
  Filled 2016-11-26: qty 500

## 2016-11-26 MED ORDER — LIDOCAINE HCL 2 % IJ SOLN
INTRAMUSCULAR | Status: AC
  Filled 2016-11-26: qty 20

## 2016-11-26 MED ORDER — BSS IO SOLN
INTRAOCULAR | Status: DC | PRN
  Administered 2016-11-26: 30 mL via INTRAOCULAR

## 2016-11-26 MED ORDER — SODIUM HYALURONATE 10 MG/ML IO SOLN
INTRAOCULAR | Status: AC
  Filled 2016-11-26: qty 0.85

## 2016-11-26 MED ORDER — BUPIVACAINE HCL (PF) 0.75 % IJ SOLN
INTRAMUSCULAR | Status: AC
  Filled 2016-11-26: qty 10

## 2016-11-26 MED ORDER — HYPROMELLOSE (GONIOSCOPIC) 2.5 % OP SOLN
OPHTHALMIC | Status: AC
  Filled 2016-11-26: qty 15

## 2016-11-26 MED ORDER — NA CHONDROIT SULF-NA HYALURON 40-30 MG/ML IO SOLN
INTRAOCULAR | Status: AC
  Filled 2016-11-26: qty 0.5

## 2016-11-26 MED ORDER — OFLOXACIN 0.3 % OP SOLN
1.0000 [drp] | OPHTHALMIC | Status: AC
  Administered 2016-11-26 (×3): 1 [drp] via OPHTHALMIC

## 2016-11-26 MED ORDER — PHENYLEPHRINE HCL 2.5 % OP SOLN
1.0000 [drp] | OPHTHALMIC | Status: AC
  Administered 2016-11-26 (×3): 1 [drp] via OPHTHALMIC

## 2016-11-26 MED ORDER — BACITRACIN-POLYMYXIN B 500-10000 UNIT/GM OP OINT
TOPICAL_OINTMENT | OPHTHALMIC | Status: AC
Start: 2016-11-26 — End: ?
  Filled 2016-11-26: qty 3.5

## 2016-11-26 MED ORDER — OXYCODONE HCL 5 MG PO TABS: 5.0000 mg | ORAL_TABLET | Freq: Once | ORAL | Status: DC | PRN

## 2016-11-26 MED ORDER — DEXAMETHASONE SODIUM PHOSPHATE 10 MG/ML IJ SOLN
INTRAMUSCULAR | Status: DC | PRN
  Administered 2016-11-26: 10 mg via INTRAVENOUS

## 2016-11-26 MED ORDER — OXYCODONE HCL 5 MG/5ML PO SOLN: 5.0000 mg | Freq: Once | ORAL | Status: DC | PRN

## 2016-11-26 MED ORDER — MIDAZOLAM HCL 5 MG/5ML IJ SOLN
INTRAMUSCULAR | Status: DC | PRN
  Administered 2016-11-26: 2 mg via INTRAVENOUS

## 2016-11-26 MED ORDER — BACITRACIN-POLYMYXIN B 500-10000 UNIT/GM OP OINT
TOPICAL_OINTMENT | OPHTHALMIC | Status: DC | PRN
  Administered 2016-11-26: 1 via OPHTHALMIC

## 2016-11-26 MED ORDER — TETRACAINE HCL 0.5 % OP SOLN
OPHTHALMIC | Status: AC
  Filled 2016-11-26: qty 2

## 2016-11-26 MED ORDER — LIDOCAINE HCL 2 % IJ SOLN
INTRAMUSCULAR | Status: DC | PRN
  Administered 2016-11-26: 6 mL via RETROBULBAR

## 2016-11-26 MED ORDER — CYCLOPENTOLATE HCL 1 % OP SOLN
1.0000 [drp] | OPHTHALMIC | Status: AC
Start: 2016-11-26 — End: 2016-11-26
  Administered 2016-11-26 (×3): 1 [drp] via OPHTHALMIC

## 2016-11-26 MED ORDER — ONDANSETRON HCL 4 MG/2ML IJ SOLN
INTRAMUSCULAR | Status: AC
  Filled 2016-11-26: qty 2

## 2016-11-26 MED ORDER — CYCLOPENTOLATE HCL 1 % OP SOLN
OPHTHALMIC | Status: AC
  Filled 2016-11-26: qty 2

## 2016-11-26 MED ORDER — EPINEPHRINE PF 1 MG/ML IJ SOLN
INTRAMUSCULAR | Status: DC | PRN
  Administered 2016-11-26: 1 mg

## 2016-11-26 MED ORDER — TRIAMCINOLONE ACETONIDE 40 MG/ML IJ SUSP
INTRAMUSCULAR | Status: DC | PRN
  Administered 2016-11-26: 40 mg

## 2016-11-26 MED ORDER — 0.9 % SODIUM CHLORIDE (POUR BTL) OPTIME
TOPICAL | Status: DC | PRN
  Administered 2016-11-26: 1000 mL

## 2016-11-26 MED ORDER — LIDOCAINE 2% (20 MG/ML) 5 ML SYRINGE
INTRAMUSCULAR | Status: AC
  Filled 2016-11-26: qty 5

## 2016-11-26 MED ORDER — TROPICAMIDE 1 % OP SOLN
1.0000 [drp] | OPHTHALMIC | Status: AC
  Administered 2016-11-26 (×3): 1 [drp] via OPHTHALMIC

## 2016-11-26 MED ORDER — HYALURONIDASE HUMAN 150 UNIT/ML IJ SOLN
INTRAMUSCULAR | Status: AC
  Filled 2016-11-26: qty 1

## 2016-11-26 MED ORDER — BSS PLUS IO SOLN
INTRAOCULAR | Status: DC | PRN
  Administered 2016-11-26: 1 via INTRAOCULAR

## 2016-11-26 SURGICAL SUPPLY — 81 items
APL SRG 3 HI ABS STRL LF PLS (MISCELLANEOUS) ×3
APPLICATOR COTTON TIP 6IN STRL (MISCELLANEOUS) ×3 IMPLANT
APPLICATOR DR MATTHEWS STRL (MISCELLANEOUS) ×10 IMPLANT
BALL CTTN LRG ABS STRL LF (GAUZE/BANDAGES/DRESSINGS) ×2
BANDAGE EYE OVAL (MISCELLANEOUS) IMPLANT
BLADE EYE CATARACT 19 1.4 BEAV (BLADE) IMPLANT
BLADE MVR KNIFE 19G (BLADE) IMPLANT
CANNULA ANT CHAM MAIN (OPHTHALMIC RELATED) IMPLANT
CANNULA DUAL BORE 23G (CANNULA) IMPLANT
CANNULA VLV SOFT TIP 25G (OPHTHALMIC) ×1 IMPLANT
CANNULA VLV SOFT TIP 25GA (OPHTHALMIC) ×3 IMPLANT
CAUTERY EYE LOW TEMP 1300F FIN (OPHTHALMIC RELATED) IMPLANT
CORDS BIPOLAR (ELECTRODE) IMPLANT
COTTONBALL LRG STERILE PKG (GAUZE/BANDAGES/DRESSINGS) ×7 IMPLANT
COVER SURGICAL LIGHT HANDLE (MISCELLANEOUS) ×3 IMPLANT
DRAPE INCISE 51X51 W/FILM STRL (DRAPES) ×3 IMPLANT
DRAPE RETRACTOR (MISCELLANEOUS) ×3 IMPLANT
ERASER HMR WETFIELD 23G BP (MISCELLANEOUS) IMPLANT
FILTER BLUE MILLIPORE (MISCELLANEOUS) IMPLANT
FILTER STRAW FLUID ASPIR (MISCELLANEOUS) IMPLANT
FORCEPS GRIESHABER ILM 25G A (INSTRUMENTS) IMPLANT
GAS AUTO FILL CONSTEL (OPHTHALMIC)
GAS AUTO FILL CONSTELLATION (OPHTHALMIC) IMPLANT
GLOVE BIO SURGEON STRL SZ7.5 (GLOVE) ×3 IMPLANT
GLOVE BIOGEL PI IND STRL 7.5 (GLOVE) IMPLANT
GLOVE BIOGEL PI INDICATOR 7.5 (GLOVE)
GLOVE SURG SS PI 7.0 STRL IVOR (GLOVE) ×2 IMPLANT
GOWN STRL REUS W/ TWL LRG LVL3 (GOWN DISPOSABLE) ×2 IMPLANT
GOWN STRL REUS W/ TWL XL LVL3 (GOWN DISPOSABLE) IMPLANT
GOWN STRL REUS W/TWL LRG LVL3 (GOWN DISPOSABLE) ×6
GOWN STRL REUS W/TWL XL LVL3 (GOWN DISPOSABLE) ×9
HANDLE PNEUMATIC FOR CONSTEL (OPHTHALMIC) IMPLANT
KIT BASIN OR (CUSTOM PROCEDURE TRAY) ×3 IMPLANT
KIT PERFLUORON PROCEDURE 5ML (MISCELLANEOUS) IMPLANT
KIT ROOM TURNOVER OR (KITS) ×3 IMPLANT
KNIFE CRESCENT 1.75 EDGEAHEAD (BLADE) IMPLANT
KNIFE GRIESHABER SHARP 2.5MM (MISCELLANEOUS) ×3 IMPLANT
LENS BIOM SUPER VIEW SET DISP (OPHTHALMIC RELATED) ×3 IMPLANT
MICROPICK 25G (MISCELLANEOUS)
NDL 18GX1X1/2 (RX/OR ONLY) (NEEDLE) ×1 IMPLANT
NDL 25GX 5/8IN NON SAFETY (NEEDLE) ×1 IMPLANT
NDL FILTER BLUNT 18X1 1/2 (NEEDLE) ×1 IMPLANT
NDL HYPO 25GX1X1/2 BEV (NEEDLE) ×1 IMPLANT
NDL HYPO 30X.5 LL (NEEDLE) ×1 IMPLANT
NDL RETROBULBAR 25GX1.5 (NEEDLE) ×1 IMPLANT
NEEDLE 18GX1X1/2 (RX/OR ONLY) (NEEDLE) ×6 IMPLANT
NEEDLE 25GX 5/8IN NON SAFETY (NEEDLE) ×6 IMPLANT
NEEDLE FILTER BLUNT 18X 1/2SAF (NEEDLE) ×2
NEEDLE FILTER BLUNT 18X1 1/2 (NEEDLE) ×1 IMPLANT
NEEDLE HYPO 25GX1X1/2 BEV (NEEDLE) ×3 IMPLANT
NEEDLE HYPO 30X.5 LL (NEEDLE) ×3 IMPLANT
NEEDLE RETROBULBAR 25GX1.5 (NEEDLE) IMPLANT
NS IRRIG 1000ML POUR BTL (IV SOLUTION) ×3 IMPLANT
PACK VITRECTOMY CUSTOM (CUSTOM PROCEDURE TRAY) ×3 IMPLANT
PAD ARMBOARD 7.5X6 YLW CONV (MISCELLANEOUS) ×6 IMPLANT
PAK PIK VITRECTOMY CVS 25GA (OPHTHALMIC) ×3 IMPLANT
PICK MICROPICK 25G (MISCELLANEOUS) IMPLANT
PROBE LASER ILLUM FLEX CVD 25G (OPHTHALMIC) ×2 IMPLANT
REPL STRA BRUSH NDL (NEEDLE) IMPLANT
REPL STRA BRUSH NEEDLE (NEEDLE) IMPLANT
RESERVOIR BACK FLUSH (MISCELLANEOUS) IMPLANT
ROLLS DENTAL (MISCELLANEOUS) ×6 IMPLANT
SCRAPER DIAMOND 25GA (OPHTHALMIC RELATED) IMPLANT
SHEET MEDIUM DRAPE 40X70 STRL (DRAPES) ×3 IMPLANT
SPONGE SURGIFOAM ABS GEL 12-7 (HEMOSTASIS) IMPLANT
STOPCOCK 4 WAY LG BORE MALE ST (IV SETS) IMPLANT
SUT CHROMIC 7 0 TG140 8 (SUTURE) IMPLANT
SUT ETHILON 9 0 TG140 8 (SUTURE) IMPLANT
SUT MERSILENE 4 0 RV 2 (SUTURE) IMPLANT
SUT SILK 2 0 (SUTURE)
SUT SILK 2-0 18XBRD TIE 12 (SUTURE) IMPLANT
SUT SILK 4 0 RB 1 (SUTURE) IMPLANT
SYR 10ML LL (SYRINGE) ×3 IMPLANT
SYR 20CC LL (SYRINGE) ×4 IMPLANT
SYR 5ML LL (SYRINGE) ×1 IMPLANT
SYR BULB 3OZ (MISCELLANEOUS) ×1 IMPLANT
SYR TB 1ML LUER SLIP (SYRINGE) IMPLANT
TOWEL OR 17X24 6PK STRL BLUE (TOWEL DISPOSABLE) ×6 IMPLANT
TUBING HIGH PRESS EXTEN 6IN (TUBING) IMPLANT
WATER STERILE IRR 1000ML POUR (IV SOLUTION) ×3 IMPLANT
WIPE INSTRUMENT VISIWIPE 73X73 (MISCELLANEOUS) IMPLANT

## 2016-11-26 NOTE — Anesthesia Postprocedure Evaluation (Addendum)
Anesthesia Post Note  Patient: Casey Preston  Procedure(s) Performed: Procedure(s) (LRB): REPAIR OF COMPLEX TRACTION RETINAL DETACHMENT (Right)  Patient location during evaluation: PACU Anesthesia Type: MAC Level of consciousness: awake, awake and alert and oriented Pain management: pain level controlled Vital Signs Assessment: post-procedure vital signs reviewed and stable Respiratory status: spontaneous breathing, nonlabored ventilation and respiratory function stable Cardiovascular status: blood pressure returned to baseline Anesthetic complications: no       Last Vitals:  Vitals:   11/26/16 1415 11/26/16 1417  BP: 122/72   Pulse: 77   Resp: 14   Temp:  36.4 C    Last Pain:  Vitals:   11/26/16 1353  TempSrc:   PainSc: 0-No pain                 Azadeh Hyder COKER

## 2016-11-26 NOTE — Brief Op Note (Signed)
11/26/2016  1:45 PM  PATIENT:  Casey Preston  46 y.o. male  PRE-OPERATIVE DIAGNOSIS:  RIGHT TRACTIONAL RETINAL DETACHMENT, VETREOUS HEMORRHAGE  POST-OPERATIVE DIAGNOSIS:  RIGHT TRACTIONAL RETINAL DETACHMENT, VETREOUS HEMORRHAGE  PROCEDURE:  Procedure(s): REPAIR OF COMPLEX TRACTION RETINAL DETACHMENT (Right)  SURGEON:  Surgeon(s) and Role:    * Jalene Mullet, MD - Primary  PHYSICIAN ASSISTANT:   ASSISTANTS: none   ANESTHESIA:   local and MAC  EBL:  No intake/output data recorded.  BLOOD ADMINISTERED:none  DRAINS: none   LOCAL MEDICATIONS USED:  MARCAINE    and LIDOCAINE   SPECIMEN:  No Specimen  DISPOSITION OF SPECIMEN:  N/A  COUNTS:  YES  TOURNIQUET:  * No tourniquets in log *  DICTATION: .Note written in EPIC  PLAN OF CARE: Discharge to home after PACU  PATIENT DISPOSITION:  PACU - hemodynamically stable.   Delay start of Pharmacological VTE agent (>24hrs) due to surgical blood loss or risk of bleeding: not applicable

## 2016-11-26 NOTE — H&P (Signed)
  Date of examination:  11/26/16  Indication for surgery: Proliferative diabetic retinopathy with vitreous hemorrhage and tractional retinal detachment right eye   Pertinent past medical history:  Past Medical History:  Diagnosis Date  . Anemia   . Chronic kidney disease    Stage 4  . 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 Diabetes mellitus without complication ([place])   . Hypertension   . Pneumonia     Pertinent ocular history:  Proliferative diabetic retinopathy both eyes  Pertinent family history:  Family History  Problem Relation Age of Onset  . Diabetes Mother   . Diabetes Father     General:  Healthy appearing patient in no distress.  Kidney failure  Eyes:    Acuity OD 20/50  OS 20/30  [place]  Fundus: severe proliferative diabetic retinopathy with vitreous hemorrhage OD and tractional retinal detachment, OS with NVE and inferior TRD    Impression: Proliferative diabetic retinopathy with vitreous hemorrhage and tractional retinal detachment right eye    Plan: TRD repair right eye with silicone oil  Casey Preston

## 2016-11-26 NOTE — Anesthesia Preprocedure Evaluation (Signed)

## 2016-11-26 NOTE — Transfer of Care (Signed)
Immediate Anesthesia Transfer of Care Note  Patient: Casey Preston  Procedure(s) Performed: Procedure(s): REPAIR OF COMPLEX TRACTION RETINAL DETACHMENT (Right)  Patient Location: PACU  Anesthesia Type:MAC  Level of Consciousness: awake, alert , oriented and patient cooperative  Airway & Oxygen Therapy: Patient Spontanous Breathing  Post-op Assessment: Report given to RN, Post -op Vital signs reviewed and stable and Patient moving all extremities  Post vital signs: Reviewed and stable  Last Vitals:  Vitals:   11/26/16 0943 11/26/16 1353  BP: (!) 170/92 140/85  Pulse: 91 80  Resp: 18 12  Temp: 36.4 C 36.2 C    Last Pain:  Vitals:   11/26/16 1353  TempSrc:   PainSc: 0-No pain         Complications: No apparent anesthesia complications

## 2016-11-26 NOTE — Op Note (Signed)
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  A lid speculum was placed.  Infusion line and trocar was placed at the 8 o'clock position approximately 3.5 mm from the surgical limbus.   The infusion line was allowed to run and then clamped when placed at the cannula opening. The line was inserted and secured to the drape with an adhesive strip.   Active trocars/cannula were placed at the 10 and 2 o'clock positions approximately 3.5 mm from the surgical limbus. The cannula was visualized in the vitreous cavity.  The light pipe and vitreous cutter were inserted into the vitreous cavity and a core vitrectomy was performed taking care to limit any traction on the macula.  The areas of traction were carefully segmented and delaminated.  Overlying membranes were removed.  Kenalog was used to highlight the areas of traction and to limit any elevation of the tractional areas.  After careful segementation and delamination, a posterior vitreous detachment was induced and core vitrectomy completed.  Care taken to remove the vitreous up to the vitreous base for 360 degrees with the aid of scleral depression.  Several breaks were encountered in the periphery at 11, 12, 5 and 6 o'clock.  The vitreous was carefully shaved over these areas.  A complete air/fluid exchange was performed.    Endolaser was applied 360 degrees with the aid of scleral indentation to ensure adequate demarcation of the peripheral retina and areas of breaks.    12% C3F8 gas was placed in the eye.  The  trocars were sequentially removed and noted to be air tight.  Normal intraocular pressure was noted by digital palpapation.  Subconjunctival injection of Ancef and Decadron were placed.   The speculum and drapes were removed and the eye was patched with Polymixin/Bacitracin ophthalmic ointment. An eye shield was placed and the patient was transferred alert and conversant with stable vital signs to the post operative recovery area.  The patient tolerated the procedure well and no complications were noted.  Royston Cowper MD

## 2016-11-26 NOTE — Discharge Instructions (Addendum)
DO NOT SLEEP ON BACK.  SLEEP ON SIDE WITH NOSE TO PILLOW.  DURING DAY KEEP FACE DOWN.  15 MIN EVERY TWO HOURS MAY LOOK STRAIGHT AHEAD.

## 2016-11-27 ENCOUNTER — Encounter (HOSPITAL_COMMUNITY): Payer: Self-pay | Admitting: Ophthalmology

## 2016-11-30 ENCOUNTER — Ambulatory Visit (INDEPENDENT_AMBULATORY_CARE_PROVIDER_SITE_OTHER): Payer: Medicaid Other | Admitting: Surgery

## 2016-11-30 ENCOUNTER — Ambulatory Visit (INDEPENDENT_AMBULATORY_CARE_PROVIDER_SITE_OTHER)
Admission: RE | Admit: 2016-11-30 | Discharge: 2016-11-30 | Disposition: A | Discharge status: Home / Self Care | Payer: Medicaid Other | Source: Ambulatory Visit | Attending: Surgery | Admitting: Surgery

## 2016-11-30 ENCOUNTER — Ambulatory Visit (HOSPITAL_COMMUNITY)
Admission: RE | Admit: 2016-11-30 | Discharge: 2016-11-30 | Disposition: A | Discharge status: Home / Self Care | Payer: Medicaid Other | Source: Ambulatory Visit | Attending: Surgery | Admitting: Surgery

## 2016-11-30 ENCOUNTER — Other Ambulatory Visit: Payer: Self-pay

## 2016-11-30 ENCOUNTER — Encounter: Payer: Self-pay | Admitting: Surgery

## 2016-11-30 VITALS — BP 156/89 | HR 80 | Temp 97.2°F | Resp 20 | Ht 70.0 in | Wt 173.9 lb

## 2016-11-30 DIAGNOSIS — N185 Chronic kidney disease, stage 5: Secondary | ICD-10-CM

## 2016-11-30 DIAGNOSIS — Z01810 Encounter for preprocedural cardiovascular examination: Secondary | ICD-10-CM

## 2016-11-30 NOTE — Progress Notes (Signed)
[facility] [place]  Patient name: Casey Preston MRN: [id number] DOB: [birth year] Sex: male   REFERRING PROVIDER:    Dr. Lorrene Reid   REASON FOR CONSULT:    Dialysis access  HISTORY OF PRESENT ILLNESS:   Casey Preston is a 46 y.o. male, who is Referred for dialysis access.  The patient's renal failure is secondary to diabetes.  His complicated by secondary hyperparathyroidism.  He recently had surgery for diabetic retinopathy.  He is right-handed.  He recently had a significant weight gain approximate 40 pounds which she has lost with diuresis.  He is medically managed for hypertension  PAST MEDICAL HISTORY    Past Medical History:  Diagnosis Date  . Anemia   . Chronic kidney disease    Stage 4  . 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 Diabetes mellitus without complication ([place])   . Hypertension   . Pneumonia      FAMILY HISTORY   Family History  Problem Relation Age of Onset  . Diabetes Mother   . Diabetes Father     SOCIAL HISTORY:   Social History   Social History  . Marital status: Married    Spouse name: N/A  . Number of children: N/A  . Years of education: N/A   Occupational History  . Not on file.   Social History Main Topics  . Smoking status: Never Smoker  . Smokeless tobacco: Current User    Types: Snuff  . Alcohol use No  . Drug use: Yes    Types: Marijuana     Comment: last use 11/24/16  . Sexual activity: Not on file   Other Topics Concern  . Not on file   Social History Narrative  . No narrative on file    ALLERGIES:    Allergies  Allergen Reactions  . No Known Allergies     CURRENT MEDICATIONS:    Current Outpatient Prescriptions  Medication Sig Dispense Refill  . amLODipine (NORVASC) 10 MG tablet Take 10 mg by mouth daily.    . cloNIDine (CATAPRES) 0.2 MG tablet Take 0.2 mg by mouth daily.    . ferrous sulfate (FE TABS) 325 (65 FE) MG EC  tablet Take 1 tablet (325 mg total) by mouth 3 (three) times daily with meals. (Patient not taking: Reported on 11/20/2016) 947 tablet 0  . folic acid (FOLVITE) 1 MG tablet Take 1 tablet (1 mg total) by mouth daily. (Patient not taking: Reported on 11/20/2016) 60 tablet 0  . furosemide (LASIX) 80 MG tablet Take 160 mg by mouth 2 (two) times daily.    . hydrALAZINE (APRESOLINE) 100 MG tablet Take 100 mg by mouth every 12 (twelve) hours.    . isosorbide mononitrate (IMDUR) 30 MG 24 hr tablet Take 1 tablet (30 mg total) by mouth daily. 30 tablet 0  . labetalol (NORMODYNE) 200 MG tablet Take 200 mg by mouth 2 (two) times daily.    . metolazone (ZAROXOLYN) 2.5 MG tablet Take 2.5 mg by mouth every Monday, Wednesday, and Friday.    . Naphazoline HCl (CLEAR EYES OP) Apply 1 drop to eye daily as needed (irritation).      No current facility-administered medications for this visit.     REVIEW OF SYSTEMS:   [X]  denotes positive finding, [ ]  denotes negative finding Cardiac  Comments:  Chest pain or chest pressure:    Shortness of breath upon exertion:    Short  of breath when lying flat:    Irregular heart rhythm:        Vascular    Pain in calf, thigh, or hip brought on by ambulation:    Pain in feet at night that wakes you up from your sleep:     Blood clot in your veins:    Leg swelling:  x       Pulmonary    Oxygen at home:    Productive cough:     Wheezing:         Neurologic    Sudden weakness in arms or legs:     Sudden numbness in arms or legs:     Sudden onset of difficulty speaking or slurred speech:    Temporary loss of vision in one eye:     Problems with dizziness:         Gastrointestinal    Blood in stool:      Vomited blood:         Genitourinary    Burning when urinating:     Blood in urine:        Psychiatric    Major depression:         Hematologic    Bleeding problems:    Problems with blood clotting too easily:        Skin    Rashes or ulcers:          Constitutional    Fever or chills:     PHYSICAL EXAM:   Vitals:   11/30/16 1114 11/30/16 1120  BP: (!) 141/86 (!) 156/89  Pulse: 80   Resp: 20   Temp: 97.2 F (36.2 C)   TempSrc: Oral   SpO2: 98%   Weight: 173 lb 14.4 oz (78.9 kg)   Height: 5\' 10"  (1.778 m)     GENERAL: The patient is a well-nourished male, in no acute distress. The vital signs are documented above. CARDIAC: There is a regular rate and rhythm.  VASCULAR: Palpable left radial and brachial pulse PULMONARY: Nonlabored respirations MUSCULOSKELETAL: There are no major deformities or cyanosis. NEUROLOGIC: No focal weakness or paresthesias are detected. SKIN: There are no ulcers or rashes noted. PSYCHIATRIC: The patient has a normal affect.  STUDIES:   I have reviewed his vascular lab studies.  He has an adequate left basilic vein.  Arterial Dopplers were normal  ASSESSMENT and PLAN   States 5 renal insufficiency: The patient is scheduled for a left first stage basilic vein procedure on Thursday, May 10.  I discussed the risks and benefits of the procedure including the risk of non-maturity and the risk of steal syndrome.  In addition, the patient is going to require a tunneled dialysis catheter.  I confirmed the need for a catheter with Dr. Lorrene Reid.  The patient will likely start dialysis next week.  I also spoke with Dr. Posey Pronto, his eye surgeon.  The patient can proceed with surgery without any restrictions.   Annamarie Major, MD [facility] [phone] Pager [phone]

## 2016-12-02 ENCOUNTER — Encounter (HOSPITAL_COMMUNITY): Payer: Self-pay | Admitting: *Deleted

## 2016-12-03 ENCOUNTER — Ambulatory Visit (HOSPITAL_COMMUNITY): Payer: Medicaid Other | Admitting: Certified Registered"

## 2016-12-03 ENCOUNTER — Ambulatory Visit (HOSPITAL_COMMUNITY): Payer: Medicaid Other

## 2016-12-03 ENCOUNTER — Encounter (HOSPITAL_COMMUNITY)
Admission: RE | Disposition: A | Discharge status: Home / Self Care | Payer: Self-pay | Source: Ambulatory Visit | Attending: Surgery

## 2016-12-03 ENCOUNTER — Ambulatory Visit (HOSPITAL_COMMUNITY)
Admission: RE | Admit: 2016-12-03 | Discharge: 2016-12-03 | Disposition: A | Discharge status: Home / Self Care | Payer: Medicaid Other | Source: Ambulatory Visit | Attending: Surgery | Admitting: Surgery

## 2016-12-03 ENCOUNTER — Encounter (HOSPITAL_COMMUNITY): Payer: Self-pay | Admitting: Urology

## 2016-12-03 DIAGNOSIS — E11319 Type 2 diabetes mellitus with unspecified diabetic retinopathy without macular edema: Secondary | ICD-10-CM | POA: Diagnosis not present

## 2016-12-03 DIAGNOSIS — Z833 Family history of diabetes mellitus: Secondary | ICD-10-CM | POA: Insufficient documentation

## 2016-12-03 DIAGNOSIS — E1122 Type 2 diabetes mellitus with diabetic chronic kidney disease: Secondary | ICD-10-CM | POA: Diagnosis not present

## 2016-12-03 DIAGNOSIS — N185 Chronic kidney disease, stage 5: Secondary | ICD-10-CM | POA: Diagnosis not present

## 2016-12-03 DIAGNOSIS — F17290 Nicotine dependence, other tobacco product, uncomplicated: Secondary | ICD-10-CM | POA: Diagnosis not present

## 2016-12-03 DIAGNOSIS — Z452 Encounter for adjustment and management of vascular access device: Secondary | ICD-10-CM | POA: Insufficient documentation

## 2016-12-03 DIAGNOSIS — Z95828 Presence of other vascular implants and grafts: Secondary | ICD-10-CM

## 2016-12-03 DIAGNOSIS — Z79899 Other long term (current) drug therapy: Secondary | ICD-10-CM | POA: Insufficient documentation

## 2016-12-03 DIAGNOSIS — N2581 Secondary hyperparathyroidism of renal origin: Secondary | ICD-10-CM | POA: Insufficient documentation

## 2016-12-03 DIAGNOSIS — Z992 Dependence on renal dialysis: Secondary | ICD-10-CM

## 2016-12-03 DIAGNOSIS — Z419 Encounter for procedure for purposes other than remedying health state, unspecified: Secondary | ICD-10-CM

## 2016-12-03 DIAGNOSIS — I120 Hypertensive chronic kidney disease with stage 5 chronic kidney disease or end stage renal disease: Secondary | ICD-10-CM | POA: Insufficient documentation

## 2016-12-03 HISTORY — PX: INSERTION OF DIALYSIS CATHETER: SHX1324

## 2016-12-03 HISTORY — PX: BASCILIC VEIN TRANSPOSITION: SHX5742

## 2016-12-03 LAB — POCT I-STAT 4, (NA,K, GLUC, HGB,HCT)
Glucose, Bld: 142 mg/dL — ABNORMAL HIGH (ref 65–99)
HEMATOCRIT: 31 % — AB (ref 39.0–52.0)
Hemoglobin: 10.5 g/dL — ABNORMAL LOW (ref 13.0–17.0)
Potassium: 4.1 mmol/L (ref 3.5–5.1)
SODIUM: 140 mmol/L (ref 135–145)

## 2016-12-03 LAB — GLUCOSE, CAPILLARY: Glucose-Capillary: 170 mg/dL — ABNORMAL HIGH (ref 65–99)

## 2016-12-03 SURGERY — TRANSPOSITION, VEIN, BASILIC
Anesthesia: Monitor Anesthesia Care | Site: Neck | Laterality: Right

## 2016-12-03 MED ORDER — PROPOFOL 500 MG/50ML IV EMUL
INTRAVENOUS | Status: DC | PRN
  Administered 2016-12-03: 50 ug/kg/min via INTRAVENOUS

## 2016-12-03 MED ORDER — 0.9 % SODIUM CHLORIDE (POUR BTL) OPTIME
TOPICAL | Status: DC | PRN
  Administered 2016-12-03: 1000 mL

## 2016-12-03 MED ORDER — OXYCODONE HCL 5 MG PO TABS: 5.0000 mg | ORAL_TABLET | Freq: Once | ORAL | Status: DC | PRN

## 2016-12-03 MED ORDER — PROPOFOL 10 MG/ML IV BOLUS
INTRAVENOUS | Status: AC
  Filled 2016-12-03: qty 20

## 2016-12-03 MED ORDER — MIDAZOLAM HCL 2 MG/2ML IJ SOLN
INTRAMUSCULAR | Status: AC
  Filled 2016-12-03: qty 2

## 2016-12-03 MED ORDER — HEPARIN SODIUM (PORCINE) 1000 UNIT/ML IJ SOLN
INTRAMUSCULAR | Status: AC
  Filled 2016-12-03: qty 1

## 2016-12-03 MED ORDER — ONDANSETRON HCL 4 MG/2ML IJ SOLN: 4.0000 mg | Freq: Once | INTRAMUSCULAR | Status: DC | PRN

## 2016-12-03 MED ORDER — FENTANYL CITRATE (PF) 100 MCG/2ML IJ SOLN: 25.0000 ug | INTRAMUSCULAR | Status: DC | PRN

## 2016-12-03 MED ORDER — PHENYLEPHRINE 40 MCG/ML (10ML) SYRINGE FOR IV PUSH (FOR BLOOD PRESSURE SUPPORT)
PREFILLED_SYRINGE | INTRAVENOUS | Status: AC
  Filled 2016-12-03: qty 10

## 2016-12-03 MED ORDER — SUCCINYLCHOLINE CHLORIDE 200 MG/10ML IV SOSY
PREFILLED_SYRINGE | INTRAVENOUS | Status: AC
  Filled 2016-12-03: qty 10

## 2016-12-03 MED ORDER — OXYCODONE HCL 5 MG/5ML PO SOLN: 5.0000 mg | Freq: Once | ORAL | Status: DC | PRN

## 2016-12-03 MED ORDER — FENTANYL CITRATE (PF) 250 MCG/5ML IJ SOLN
INTRAMUSCULAR | Status: DC | PRN
  Administered 2016-12-03: 25 ug via INTRAVENOUS
  Administered 2016-12-03: 50 ug via INTRAVENOUS

## 2016-12-03 MED ORDER — SODIUM CHLORIDE 0.9 % IV SOLN
INTRAVENOUS | Status: DC | PRN
  Administered 2016-12-03: 08:00:00

## 2016-12-03 MED ORDER — LIDOCAINE-EPINEPHRINE (PF) 1 %-1:200000 IJ SOLN
INTRAMUSCULAR | Status: DC | PRN
  Administered 2016-12-03: 30 mL

## 2016-12-03 MED ORDER — DEXTROSE 5 % IV SOLN
1.5000 g | INTRAVENOUS | Status: AC
  Administered 2016-12-03: 1.5 g via INTRAVENOUS

## 2016-12-03 MED ORDER — SODIUM CHLORIDE 0.9 % IV SOLN
INTRAVENOUS | Status: DC
  Administered 2016-12-03: 07:00:00 via INTRAVENOUS

## 2016-12-03 MED ORDER — HEPARIN SODIUM (PORCINE) 1000 UNIT/ML IJ SOLN
INTRAMUSCULAR | Status: DC | PRN
  Administered 2016-12-03: 3000 [IU] via INTRAVENOUS

## 2016-12-03 MED ORDER — HEPARIN SODIUM (PORCINE) 1000 UNIT/ML IJ SOLN
INTRAMUSCULAR | Status: DC | PRN
  Administered 2016-12-03: 1000 [IU] via INTRAVENOUS

## 2016-12-03 MED ORDER — LIDOCAINE-EPINEPHRINE (PF) 1 %-1:200000 IJ SOLN
INTRAMUSCULAR | Status: AC
  Filled 2016-12-03: qty 30

## 2016-12-03 MED ORDER — EPHEDRINE 5 MG/ML INJ
INTRAVENOUS | Status: AC
  Filled 2016-12-03: qty 10

## 2016-12-03 MED ORDER — LIDOCAINE 2% (20 MG/ML) 5 ML SYRINGE
INTRAMUSCULAR | Status: AC
  Filled 2016-12-03: qty 5

## 2016-12-03 MED ORDER — PROTAMINE SULFATE 10 MG/ML IV SOLN
INTRAVENOUS | Status: DC | PRN
  Administered 2016-12-03: 10 mg via INTRAVENOUS
  Administered 2016-12-03: 15 mg via INTRAVENOUS

## 2016-12-03 MED ORDER — HEMOSTATIC AGENTS (NO CHARGE) OPTIME
TOPICAL | Status: DC | PRN
  Administered 2016-12-03: 1 via TOPICAL

## 2016-12-03 MED ORDER — FENTANYL CITRATE (PF) 250 MCG/5ML IJ SOLN
INTRAMUSCULAR | Status: AC
  Filled 2016-12-03: qty 5

## 2016-12-03 MED ORDER — MIDAZOLAM HCL 5 MG/5ML IJ SOLN
INTRAMUSCULAR | Status: DC | PRN
  Administered 2016-12-03: 2 mg via INTRAVENOUS

## 2016-12-03 MED ORDER — OXYCODONE-ACETAMINOPHEN 5-325 MG PO TABS: 1.0000 | ORAL_TABLET | Freq: Four times a day (QID) | ORAL | 0 refills | Status: DC | PRN

## 2016-12-03 MED ORDER — CHLORHEXIDINE GLUCONATE 4 % EX LIQD: 1.0000 "application " | Freq: Once | CUTANEOUS | Status: DC

## 2016-12-03 SURGICAL SUPPLY — 70 items
ADH SKN CLS APL DERMABOND .7 (GAUZE/BANDAGES/DRESSINGS) ×4
ARMBAND PINK RESTRICT EXTREMIT (MISCELLANEOUS) ×4 IMPLANT
BAG DECANTER FOR FLEXI CONT (MISCELLANEOUS) ×4 IMPLANT
BIOPATCH BLUE 3/4IN DISK W/1.5 (GAUZE/BANDAGES/DRESSINGS) ×2 IMPLANT
BIOPATCH RED 1 DISK 7.0 (GAUZE/BANDAGES/DRESSINGS) ×3 IMPLANT
BIOPATCH RED 1IN DISK 7.0MM (GAUZE/BANDAGES/DRESSINGS) ×1
CANISTER SUCT 3000ML PPV (MISCELLANEOUS) ×4 IMPLANT
CATH PALINDROME RT-P 15FX19CM (CATHETERS) IMPLANT
CATH PALINDROME RT-P 15FX23CM (CATHETERS) IMPLANT
CATH PALINDROME RT-P 15FX28CM (CATHETERS) IMPLANT
CATH PALINDROME RT-P 15FX55CM (CATHETERS) IMPLANT
CLIP TI MEDIUM 24 (CLIP) IMPLANT
CLIP TI MEDIUM 6 (CLIP) IMPLANT
CLIP TI WIDE RED SMALL 24 (CLIP) IMPLANT
CLIP TI WIDE RED SMALL 6 (CLIP) IMPLANT
COVER PROBE W GEL 5X96 (DRAPES) ×4 IMPLANT
COVER SURGICAL LIGHT HANDLE (MISCELLANEOUS) ×4 IMPLANT
DERMABOND ADVANCED (GAUZE/BANDAGES/DRESSINGS) ×4
DERMABOND ADVANCED .7 DNX12 (GAUZE/BANDAGES/DRESSINGS) ×2 IMPLANT
DRAPE C-ARM 42X72 X-RAY (DRAPES) ×4 IMPLANT
DRAPE CHEST BREAST 15X10 FENES (DRAPES) ×4 IMPLANT
ELECT CAUTERY BLADE 6.4 (BLADE) ×2 IMPLANT
ELECT REM PT RETURN 9FT ADLT (ELECTROSURGICAL) ×4
ELECTRODE REM PT RTRN 9FT ADLT (ELECTROSURGICAL) ×2 IMPLANT
GAUZE SPONGE 4X4 16PLY XRAY LF (GAUZE/BANDAGES/DRESSINGS) ×4 IMPLANT
GLOVE BIO SURGEON STRL SZ 6.5 (GLOVE) ×1 IMPLANT
GLOVE BIO SURGEON STRL SZ7 (GLOVE) ×2 IMPLANT
GLOVE BIO SURGEONS STRL SZ 6.5 (GLOVE) ×1
GLOVE BIOGEL PI IND STRL 6.5 (GLOVE) IMPLANT
GLOVE BIOGEL PI IND STRL 7.0 (GLOVE) IMPLANT
GLOVE BIOGEL PI IND STRL 7.5 (GLOVE) ×2 IMPLANT
GLOVE BIOGEL PI INDICATOR 6.5 (GLOVE) ×4
GLOVE BIOGEL PI INDICATOR 7.0 (GLOVE) ×2
GLOVE BIOGEL PI INDICATOR 7.5 (GLOVE) ×2
GLOVE SURG SS PI 6.5 STRL IVOR (GLOVE) ×2 IMPLANT
GLOVE SURG SS PI 7.5 STRL IVOR (GLOVE) ×4 IMPLANT
GOWN STRL REUS W/ TWL LRG LVL3 (GOWN DISPOSABLE) ×4 IMPLANT
GOWN STRL REUS W/ TWL XL LVL3 (GOWN DISPOSABLE) ×2 IMPLANT
GOWN STRL REUS W/TWL LRG LVL3 (GOWN DISPOSABLE) ×16
GOWN STRL REUS W/TWL XL LVL3 (GOWN DISPOSABLE) ×8
HEMOSTAT SNOW SURGICEL 2X4 (HEMOSTASIS) IMPLANT
KIT BASIN OR (CUSTOM PROCEDURE TRAY) ×4 IMPLANT
KIT ROOM TURNOVER OR (KITS) ×4 IMPLANT
NDL 18GX1X1/2 (RX/OR ONLY) (NEEDLE) ×2 IMPLANT
NDL HYPO 25GX1X1/2 BEV (NEEDLE) ×2 IMPLANT
NEEDLE 18GX1X1/2 (RX/OR ONLY) (NEEDLE) ×8 IMPLANT
NEEDLE HYPO 25GX1X1/2 BEV (NEEDLE) ×4 IMPLANT
NS IRRIG 1000ML POUR BTL (IV SOLUTION) ×4 IMPLANT
PACK CV ACCESS (CUSTOM PROCEDURE TRAY) ×4 IMPLANT
PACK SURGICAL SETUP 50X90 (CUSTOM PROCEDURE TRAY) ×4 IMPLANT
PAD ARMBOARD 7.5X6 YLW CONV (MISCELLANEOUS) ×8 IMPLANT
POWDER SURGICEL 3.0 GRAM (HEMOSTASIS) ×1 IMPLANT
SET MICROPUNCTURE 5F STIFF (MISCELLANEOUS) ×2 IMPLANT
SOAP 2 % CHG 4 OZ (WOUND CARE) ×4 IMPLANT
SUT ETHILON 3 0 PS 1 (SUTURE) ×4 IMPLANT
SUT PROLENE 6 0 BV (SUTURE) ×2 IMPLANT
SUT PROLENE 6 0 CC (SUTURE) ×4 IMPLANT
SUT SILK 2 0 SH (SUTURE) IMPLANT
SUT VIC AB 3-0 SH 27 (SUTURE) ×4
SUT VIC AB 3-0 SH 27X BRD (SUTURE) ×2 IMPLANT
SUT VICRYL 4-0 PS2 18IN ABS (SUTURE) ×4 IMPLANT
SYR 10ML LL (SYRINGE) ×3 IMPLANT
SYR 20CC LL (SYRINGE) ×10 IMPLANT
SYR 5ML LL (SYRINGE) ×4 IMPLANT
SYR CONTROL 10ML LL (SYRINGE) ×4 IMPLANT
TOWEL OR 17X24 6PK STRL BLUE (TOWEL DISPOSABLE) ×4 IMPLANT
TOWEL OR 17X26 10 PK STRL BLUE (TOWEL DISPOSABLE) ×4 IMPLANT
UNDERPAD 30X30 (UNDERPADS AND DIAPERS) ×4 IMPLANT
WATER STERILE IRR 1000ML POUR (IV SOLUTION) ×4 IMPLANT
WIRE MINI STICK MAX (SHEATH) ×2 IMPLANT

## 2016-12-03 NOTE — H&P (View-Only) (Signed)
[facility] [place]  Patient name: Casey Preston MRN: [id number] DOB: 1971-05-25 Sex: male   REFERRING PROVIDER:    Dr. Lorrene Reid   REASON FOR CONSULT:    Dialysis access  HISTORY OF PRESENT ILLNESS:   Casey Preston is a 46 y.o. male, who is Referred for dialysis access.  The patient's renal failure is secondary to diabetes.  His complicated by secondary hyperparathyroidism.  He recently had surgery for diabetic retinopathy.  He is right-handed.  He recently had a significant weight gain approximate 40 pounds which she has lost with diuresis.  He is medically managed for hypertension  PAST MEDICAL HISTORY    Past Medical History:  Diagnosis Date  . Anemia   . Chronic kidney disease    Stage 4  . 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 Diabetes mellitus without complication ([place])   . Hypertension   . Pneumonia      FAMILY HISTORY   Family History  Problem Relation Age of Onset  . Diabetes Mother   . Diabetes Father     SOCIAL HISTORY:   Social History   Social History  . Marital status: Married    Spouse name: N/A  . Number of children: N/A  . Years of education: N/A   Occupational History  . Not on file.   Social History Main Topics  . Smoking status: Never Smoker  . Smokeless tobacco: Current User    Types: Snuff  . Alcohol use No  . Drug use: Yes    Types: Marijuana     Comment: last use 11/24/16  . Sexual activity: Not on file   Other Topics Concern  . Not on file   Social History Narrative  . No narrative on file    ALLERGIES:    Allergies  Allergen Reactions  . No Known Allergies     CURRENT MEDICATIONS:    Current Outpatient Prescriptions  Medication Sig Dispense Refill  . amLODipine (NORVASC) 10 MG tablet Take 10 mg by mouth daily.    . cloNIDine (CATAPRES) 0.2 MG tablet Take 0.2 mg by mouth daily.    . ferrous sulfate (FE TABS) 325 (65 FE) MG EC  tablet Take 1 tablet (325 mg total) by mouth 3 (three) times daily with meals. (Patient not taking: Reported on 11/20/2016) 419 tablet 0  . folic acid (FOLVITE) 1 MG tablet Take 1 tablet (1 mg total) by mouth daily. (Patient not taking: Reported on 11/20/2016) 60 tablet 0  . furosemide (LASIX) 80 MG tablet Take 160 mg by mouth 2 (two) times daily.    . hydrALAZINE (APRESOLINE) 100 MG tablet Take 100 mg by mouth every 12 (twelve) hours.    . isosorbide mononitrate (IMDUR) 30 MG 24 hr tablet Take 1 tablet (30 mg total) by mouth daily. 30 tablet 0  . labetalol (NORMODYNE) 200 MG tablet Take 200 mg by mouth 2 (two) times daily.    . metolazone (ZAROXOLYN) 2.5 MG tablet Take 2.5 mg by mouth every Monday, Wednesday, and Friday.    . Naphazoline HCl (CLEAR EYES OP) Apply 1 drop to eye daily as needed (irritation).      No current facility-administered medications for this visit.     REVIEW OF SYSTEMS:   [X]  denotes positive finding, [ ]  denotes negative finding Cardiac  Comments:  Chest pain or chest pressure:    Shortness of breath upon exertion:    Short  of breath when lying flat:    Irregular heart rhythm:        Vascular    Pain in calf, thigh, or hip brought on by ambulation:    Pain in feet at night that wakes you up from your sleep:     Blood clot in your veins:    Leg swelling:  x       Pulmonary    Oxygen at home:    Productive cough:     Wheezing:         Neurologic    Sudden weakness in arms or legs:     Sudden numbness in arms or legs:     Sudden onset of difficulty speaking or slurred speech:    Temporary loss of vision in one eye:     Problems with dizziness:         Gastrointestinal    Blood in stool:      Vomited blood:         Genitourinary    Burning when urinating:     Blood in urine:        Psychiatric    Major depression:         Hematologic    Bleeding problems:    Problems with blood clotting too easily:        Skin    Rashes or ulcers:          Constitutional    Fever or chills:     PHYSICAL EXAM:   Vitals:   11/30/16 1114 11/30/16 1120  BP: (!) 141/86 (!) 156/89  Pulse: 80   Resp: 20   Temp: 97.2 F (36.2 C)   TempSrc: Oral   SpO2: 98%   Weight: 173 lb 14.4 oz (78.9 kg)   Height: 5\' 10"  (1.778 m)     GENERAL: The patient is a well-nourished male, in no acute distress. The vital signs are documented above. CARDIAC: There is a regular rate and rhythm.  VASCULAR: Palpable left radial and brachial pulse PULMONARY: Nonlabored respirations MUSCULOSKELETAL: There are no major deformities or cyanosis. NEUROLOGIC: No focal weakness or paresthesias are detected. SKIN: There are no ulcers or rashes noted. PSYCHIATRIC: The patient has a normal affect.  STUDIES:   I have reviewed his vascular lab studies.  He has an adequate left basilic vein.  Arterial Dopplers were normal  ASSESSMENT and PLAN   States 5 renal insufficiency: The patient is scheduled for a left first stage basilic vein procedure on Thursday, May 10.  I discussed the risks and benefits of the procedure including the risk of non-maturity and the risk of steal syndrome.  In addition, the patient is going to require a tunneled dialysis catheter.  I confirmed the need for a catheter with Dr. Lorrene Reid.  The patient will likely start dialysis next week.  I also spoke with Dr. Posey Pronto, his eye surgeon.  The patient can proceed with surgery without any restrictions.   Annamarie Major, MD [facility] [phone] Pager [phone]

## 2016-12-03 NOTE — Anesthesia Preprocedure Evaluation (Signed)
Anesthesia Evaluation  Patient identified by MRN, date of birth, ID band Patient awake    Reviewed: Allergy & Precautions, NPO status , Patient's Chart, lab work & pertinent test results  Airway Mallampati: II  TM Distance: >3 FB     Dental   Pulmonary pneumonia,    breath sounds clear to auscultation       Cardiovascular hypertension,  Rhythm:Regular Rate:Normal     Neuro/Psych    GI/Hepatic negative GI ROS, Neg liver ROS,   Endo/Other  diabetes  Renal/GU Renal disease     Musculoskeletal   Abdominal   Peds  Hematology  (+) anemia ,   Anesthesia Other Findings   Reproductive/Obstetrics                             Anesthesia Physical Anesthesia Plan  ASA: III  Anesthesia Plan: MAC   Post-op Pain Management:    Induction: Intravenous  Airway Management Planned: Simple Face Mask  Additional Equipment:   Intra-op Plan:   Post-operative Plan:   Informed Consent: I have reviewed the patients History and Physical, chart, labs and discussed the procedure including the risks, benefits and alternatives for the proposed anesthesia with the patient or authorized representative who has indicated his/her understanding and acceptance.   Dental advisory given  Plan Discussed with: CRNA and Anesthesiologist  Anesthesia Plan Comments:         Anesthesia Quick Evaluation

## 2016-12-03 NOTE — Progress Notes (Signed)
BP elevated in preop 200/110, pt asymptomatic. Pt took all of his blood pressure medicines this morning at 05:50. Dr. Nyoka Cowden notified. Pt last BP checks 175/100 and 195/99. Valeria CRNA notified OR to text Dr. Trula Slade regarding BP.

## 2016-12-03 NOTE — Transfer of Care (Signed)
Immediate Anesthesia Transfer of Care Note  Patient: Casey Preston  Procedure(s) Performed: Procedure(s): BASCILIC VEIN TRANSPOSITION-LEFT 1ST STAGE (Left) INSERTION OF DIALYSIS CATHETER (Right)  Patient Location: PACU  Anesthesia Type:MAC  Level of Consciousness: awake, alert , oriented and patient cooperative  Airway & Oxygen Therapy: Patient Spontanous Breathing and Patient connected to nasal cannula oxygen  Post-op Assessment: Report given to RN, Post -op Vital signs reviewed and stable and Patient moving all extremities  Post vital signs: Reviewed and stable  Last Vitals:  Vitals:   12/03/16 0700 12/03/16 0720  BP: (!) 195/99 (!) 179/92  Pulse:    Resp:    Temp:      Last Pain:  Vitals:   12/03/16 0635  TempSrc: Oral         Complications: No apparent anesthesia complications

## 2016-12-03 NOTE — Anesthesia Postprocedure Evaluation (Signed)
Anesthesia Post Note  Patient: Casey Preston  Procedure(s) Performed: Procedure(s) (LRB): BASCILIC VEIN TRANSPOSITION-LEFT 1ST STAGE (Left) INSERTION OF DIALYSIS CATHETER (Right)  Patient location during evaluation: PACU Anesthesia Type: MAC Level of consciousness: awake Pain management: pain level controlled Vital Signs Assessment: post-procedure vital signs reviewed and stable Respiratory status: spontaneous breathing Cardiovascular status: stable Anesthetic complications: no       Last Vitals:  Vitals:   12/03/16 1000 12/03/16 1014  BP:  123/75  Pulse: 81 80  Resp:  13  Temp:      Last Pain:  Vitals:   12/03/16 1015  TempSrc:   PainSc: Asleep                 Chamara Dyck

## 2016-12-03 NOTE — Op Note (Signed)
Patient name: Casey Preston MRN: [id number] DOB: 1971-02-29 Sex: male  12/03/2016 Pre-operative Diagnosis: CKD V Post-operative diagnosis:  Same Surgeon:  Annamarie Major Assistants:  Silva Bandy Procedure:   #1: Ultrasound guided placement of a 23 cm Pallindrome right internal jugular vein catheter   #2: Left brachiocephalic fistula Anesthesia:  Mac Blood Loss:  See anesthesia record Specimens:  None  Findings:  Adequate basilic vein that dilated to about 5 mm.  Indications:  The patient is here for dialysis access.  He is not yet on dialysis but plans to start next week  Procedure:  The patient was identified in the holding area and taken to [place] 12  The patient was then placed supine on the table. MAC anesthesia was administered.  The patient was prepped and draped in the usual sterile fashion.  A time out was called and antibiotics were administered.  Ultrasound was used to evaluate the right internal jugular vein which was found to be easily compressible widely patent.  One percent lidocaine was used for local and procedure.  The right internal jugular vein was cannulated under ultrasound guidance, micropuncture needle.  An 018 wire was advanced without resistance.  A micropuncture sheath was placed.  An 035 wire was then inserted into the right atrium.  Subcutaneous tract was dilated with sequential dilators and peel-away sheath was placed.  A 23 cm Pallindrome catheter was then inserted and positioned with the tip of the cavo-atrial junction.  A skin exit site was selected.  This was anesthetized with 1% lidocaine.  A #11 blade was used to make a skin nick and a subcutaneous tunnel was created.  Cath was brought through the tunnel and the cuff was situated at the skin exit site.  The cath was then connected to the ports, both of which flushed and aspirated without difficulty.  Fluoroscopy was used to confirm the tip of the catheter was at the cavoatrial junction and that there were  no kinks within the catheter.  The catheter was in the appropriate volumes of heparin and sutured into place with 3-0 nylons.  The skin incision the neck was closed with 4-0 Vicryl followed by Dermabond.  The patient's left arm was then prepped and draped sterilely.   I used ultrasound to evaluate the cephalic vein in the arm.  It appeared to be of adequate diameter in the forearm but then became very small in the upper arm.  I previously decided to use the basilic vein and S1 I ended up doing.  An oblique incision was made at the level of the antecubital crease.  Through this incision I dissected out the brachial artery which was a 3.5 mm artery which was disease-free.  It was mobilized proximally and distally and encircled with vessel loops.  Next the basilic vein was isolated.  The nerve was protected.  There were 2 branches I took the larger branch.  Once adequate vein was exposed, the patient was fully heparinized.  I used a pen to mark the vein.  It was then transected and ligated distally with a 2-0 silk tie.  3000 units of heparin was given.  After that had circulated, the brachial artery was occluded with vascular clamps and a #11 blade was used to make an arteriotomy which was extended longitudinally with Potts scissors.  The vein was distended with heparinized saline.  It distended nicely to about 5 mm.  A running end-to-side anastomosis was created with 6-0 Prolene.  Prior  to completion, the appropriate flushing maneuvers were performed the anastomosis was completed.  There was excellent thrill within the fistula.  I made sure that there were no kinks or branches visualized.  I did have to ligate one branch.  The patient had excellent radial and ulnar Doppler signal at the end of the case that did not change with fistula compression.  25 mg of protamine wasn't given.  Once hemostasis was satisfactory, the incision was closed with 2 layers of 3-0 Vicryl followed by Dermabond.     Disposition: To PACU  stable  V. Annamarie Major, M.D. [facility] [place] Office: [phone] Pager:  [phone]

## 2016-12-03 NOTE — Interval H&P Note (Signed)
History and Physical Interval Note:  12/03/2016 7:20 AM  Casey Preston  has presented today for surgery, with the diagnosis of Chronic Kidney Disease Stage V  N18.5  The various methods of treatment have been discussed with the patient and family. After consideration of risks, benefits and other options for treatment, the patient has consented to  Procedure(s): [place] (Left) INSERTION OF DIALYSIS CATHETER (N/A) as a surgical intervention .  The patient's history has been reviewed, patient examined, no change in status, stable for surgery.  I have reviewed the patient's chart and labs.  Questions were answered to the patient's satisfaction.     Annamarie Major

## 2016-12-04 ENCOUNTER — Encounter (HOSPITAL_COMMUNITY): Payer: Self-pay | Admitting: Surgery

## 2016-12-04 ENCOUNTER — Telehealth: Payer: Self-pay | Admitting: Surgery

## 2016-12-04 NOTE — Telephone Encounter (Signed)
-----   Message from Mena Goes, RN sent at 12/03/2016 12:50 PM EDT ----- Regarding: 4-6 weeks w/ duplex   ----- Message ----- From: Alvia Grove, PA-C Sent: 12/03/2016   9:32 AM To: Vvs Charge Pool  s/p left 1st stage BVT 12/03/16  f/u with [facility] in 4-6 weeks with duplex.  Thanks Maudie Mercury

## 2016-12-04 NOTE — Telephone Encounter (Signed)
lvm for pt or pts spouse to call back and get appt info, 12/04/16 mailed lttr to home address [place]

## 2016-12-08 DIAGNOSIS — N2581 Secondary hyperparathyroidism of renal origin: Secondary | ICD-10-CM | POA: Insufficient documentation

## 2016-12-08 DIAGNOSIS — N186 End stage renal disease: Secondary | ICD-10-CM | POA: Insufficient documentation

## 2016-12-08 DIAGNOSIS — E611 Iron deficiency: Secondary | ICD-10-CM | POA: Insufficient documentation

## 2016-12-08 DIAGNOSIS — N189 Chronic kidney disease, unspecified: Secondary | ICD-10-CM | POA: Insufficient documentation

## 2016-12-08 DIAGNOSIS — D631 Anemia in chronic kidney disease: Secondary | ICD-10-CM | POA: Insufficient documentation

## 2016-12-08 DIAGNOSIS — E875 Hyperkalemia: Secondary | ICD-10-CM | POA: Insufficient documentation

## 2016-12-10 DIAGNOSIS — D689 Coagulation defect, unspecified: Secondary | ICD-10-CM | POA: Insufficient documentation

## 2016-12-14 DIAGNOSIS — Z992 Dependence on renal dialysis: Secondary | ICD-10-CM | POA: Insufficient documentation

## 2016-12-26 DIAGNOSIS — R0602 Shortness of breath: Secondary | ICD-10-CM | POA: Insufficient documentation

## 2017-01-06 ENCOUNTER — Encounter: Payer: Self-pay | Admitting: Surgery

## 2017-01-11 ENCOUNTER — Other Ambulatory Visit: Payer: Self-pay

## 2017-01-11 DIAGNOSIS — N185 Chronic kidney disease, stage 5: Secondary | ICD-10-CM

## 2017-01-15 DIAGNOSIS — Z23 Encounter for immunization: Secondary | ICD-10-CM | POA: Insufficient documentation

## 2017-01-18 ENCOUNTER — Ambulatory Visit (HOSPITAL_COMMUNITY)
Admission: RE | Admit: 2017-01-18 | Discharge: 2017-01-18 | Disposition: A | Discharge status: Home / Self Care | Payer: Medicaid Other | Source: Ambulatory Visit | Attending: Surgery | Admitting: Surgery

## 2017-01-18 ENCOUNTER — Encounter: Payer: Self-pay | Admitting: Surgery

## 2017-01-18 ENCOUNTER — Other Ambulatory Visit: Payer: Self-pay

## 2017-01-18 ENCOUNTER — Ambulatory Visit (INDEPENDENT_AMBULATORY_CARE_PROVIDER_SITE_OTHER): Payer: Self-pay | Admitting: Surgery

## 2017-01-18 VITALS — BP 145/87 | HR 87 | Temp 98.5°F | Resp 16 | Ht 70.0 in | Wt 161.0 lb

## 2017-01-18 DIAGNOSIS — N185 Chronic kidney disease, stage 5: Secondary | ICD-10-CM | POA: Diagnosis not present

## 2017-01-18 DIAGNOSIS — N186 End stage renal disease: Secondary | ICD-10-CM

## 2017-01-18 DIAGNOSIS — Z992 Dependence on renal dialysis: Secondary | ICD-10-CM

## 2017-01-18 NOTE — Progress Notes (Signed)
   Patient name: Casey Preston MRN: [id number] DOB: 24-Mar-1971 Sex: male  REASON FOR VISIT:     post op  HISTORY OF PRESENT ILLNESS:   Casey Preston is a 46 y.o. male who is s/p 1st stage left BVT on 12/03/2016.  At that time he also had a catheter placed.  He is now on dialysis Monday Wednesday Friday through his catheter.  He reports no symptoms of steal on the left arm.  He denies any swelling.  CURRENT MEDICATIONS:    No current outpatient prescriptions on file.   No current facility-administered medications for this visit.     REVIEW OF SYSTEMS:   [X]  denotes positive finding, [ ]  denotes negative finding Cardiac  Comments:  Chest pain or chest pressure:    Shortness of breath upon exertion:    Short of breath when lying flat:    Irregular heart rhythm:    Constitutional    Fever or chills:      PHYSICAL EXAM:   Vitals:   01/18/17 1151 01/18/17 1154  BP: (!) 150/95 (!) 145/87  Pulse: 87   Resp: 16   Temp: 98.5 F (36.9 C)   TempSrc: Oral   SpO2: 98%   Weight: 161 lb (73 kg)   Height: 5\' 10"  (1.778 m)     GENERAL: The patient is a well-nourished male, in no acute distress. The vital signs are documented above. CARDIOVASCULAR: There is a regular rate and rhythm. PULMONARY: Non-labored respirations Excellent thrill within the left basilic vein fistula.  STUDIES:   Duplex reveals an excellent diameter vein with all measurements greater than 0.6 cm.   MEDICAL ISSUES:   The patient will be scheduled for a second stage left basilic vein transposition on Thursday, July 5.  I discussed the details of the procedure as well as the incisions.  All his questions were answered.  Annamarie Major, MD [facility] [phone] Pager [phone]

## 2017-01-25 ENCOUNTER — Encounter (HOSPITAL_COMMUNITY): Payer: Self-pay | Admitting: *Deleted

## 2017-01-25 ENCOUNTER — Ambulatory Visit: Payer: Self-pay | Admitting: Ophthalmology

## 2017-01-25 ENCOUNTER — Other Ambulatory Visit: Payer: Self-pay

## 2017-01-25 NOTE — Progress Notes (Signed)
Casey Preston reports that CBG's run 98- 113. I instructed patient to check CBG after awaking and every 2 hours until arrival  to the hospital.  I Instructed patient if CBG is less than 70 to  cup of a clear juice. Recheck CBG in 15 minutes then call pre- op desk at [phone] for further instructions. If scheduled to receive Insulin, do not take Insulin.

## 2017-01-26 ENCOUNTER — Ambulatory Visit (HOSPITAL_COMMUNITY): Payer: Medicaid Other | Admitting: Anesthesiology

## 2017-01-26 ENCOUNTER — Encounter (HOSPITAL_COMMUNITY)
Admission: RE | Disposition: A | Discharge status: Home / Self Care | Payer: Self-pay | Source: Ambulatory Visit | Attending: Ophthalmology

## 2017-01-26 ENCOUNTER — Encounter (HOSPITAL_COMMUNITY): Payer: Self-pay | Admitting: Anesthesiology

## 2017-01-26 ENCOUNTER — Ambulatory Visit (HOSPITAL_COMMUNITY)
Admission: RE | Admit: 2017-01-26 | Discharge: 2017-01-26 | Disposition: A | Discharge status: Home / Self Care | Payer: Medicaid Other | Source: Ambulatory Visit | Attending: Ophthalmology | Admitting: Ophthalmology

## 2017-01-26 DIAGNOSIS — E11319 Type 2 diabetes mellitus with unspecified diabetic retinopathy without macular edema: Secondary | ICD-10-CM | POA: Diagnosis not present

## 2017-01-26 DIAGNOSIS — H3342 Traction detachment of retina, left eye: Secondary | ICD-10-CM | POA: Insufficient documentation

## 2017-01-26 DIAGNOSIS — E1122 Type 2 diabetes mellitus with diabetic chronic kidney disease: Secondary | ICD-10-CM | POA: Diagnosis not present

## 2017-01-26 DIAGNOSIS — I120 Hypertensive chronic kidney disease with stage 5 chronic kidney disease or end stage renal disease: Secondary | ICD-10-CM | POA: Diagnosis not present

## 2017-01-26 DIAGNOSIS — N186 End stage renal disease: Secondary | ICD-10-CM | POA: Diagnosis not present

## 2017-01-26 HISTORY — PX: REPAIR OF COMPLEX TRACTION RETINAL DETACHMENT: SHX6217

## 2017-01-26 HISTORY — PX: LASER PHOTO ABLATION: SHX5942

## 2017-01-26 HISTORY — DX: End stage renal disease: N18.6

## 2017-01-26 HISTORY — PX: MEMBRANE PEEL: SHX5967

## 2017-01-26 HISTORY — PX: AIR/FLUID EXCHANGE: SHX6494

## 2017-01-26 LAB — CBC
HEMATOCRIT: 40 % (ref 39.0–52.0)
Hemoglobin: 12.8 g/dL — ABNORMAL LOW (ref 13.0–17.0)
MCH: 30 pg (ref 26.0–34.0)
MCHC: 32 g/dL (ref 30.0–36.0)
MCV: 93.7 fL (ref 78.0–100.0)
Platelets: 179 10*3/uL (ref 150–400)
RBC: 4.27 MIL/uL (ref 4.22–5.81)
RDW: 15 % (ref 11.5–15.5)
WBC: 7.5 10*3/uL (ref 4.0–10.5)

## 2017-01-26 LAB — BASIC METABOLIC PANEL
ANION GAP: 8 (ref 5–15)
BUN: 27 mg/dL — ABNORMAL HIGH (ref 6–20)
CHLORIDE: 101 mmol/L (ref 101–111)
CO2: 29 mmol/L (ref 22–32)
Calcium: 8.6 mg/dL — ABNORMAL LOW (ref 8.9–10.3)
Creatinine, Ser: 3.92 mg/dL — ABNORMAL HIGH (ref 0.61–1.24)
GFR calc Af Amer: 20 mL/min — ABNORMAL LOW (ref >60)
GFR calc non Af Amer: 17 mL/min — ABNORMAL LOW (ref >60)
GLUCOSE: 102 mg/dL — AB (ref 65–99)
POTASSIUM: 3.5 mmol/L (ref 3.5–5.1)
Sodium: 138 mmol/L (ref 135–145)

## 2017-01-26 LAB — GLUCOSE, CAPILLARY: GLUCOSE-CAPILLARY: 92 mg/dL (ref 65–99)

## 2017-01-26 SURGERY — REPAIR, RETINAL DETACHMENT, COMPLEX
Anesthesia: Monitor Anesthesia Care | Site: Eye | Laterality: Left

## 2017-01-26 MED ORDER — TOBRAMYCIN-DEXAMETHASONE 0.3-0.1 % OP OINT
TOPICAL_OINTMENT | OPHTHALMIC | Status: AC
  Filled 2017-01-26: qty 3.5

## 2017-01-26 MED ORDER — ATROPINE SULFATE 1 % OP SOLN
1.0000 [drp] | OPHTHALMIC | Status: DC | PRN
  Administered 2017-01-26 (×2): 1 [drp] via OPHTHALMIC

## 2017-01-26 MED ORDER — TRIAMCINOLONE ACETONIDE 40 MG/ML IJ SUSP
INTRAMUSCULAR | Status: AC
  Filled 2017-01-26: qty 5

## 2017-01-26 MED ORDER — TOBRAMYCIN-DEXAMETHASONE 0.3-0.1 % OP OINT
TOPICAL_OINTMENT | OPHTHALMIC | Status: DC | PRN
  Administered 2017-01-26: 1 via OPHTHALMIC

## 2017-01-26 MED ORDER — EPINEPHRINE PF 1 MG/ML IJ SOLN
INTRAOCULAR | Status: DC | PRN
  Administered 2017-01-26: 08:00:00

## 2017-01-26 MED ORDER — PHENYLEPHRINE 40 MCG/ML (10ML) SYRINGE FOR IV PUSH (FOR BLOOD PRESSURE SUPPORT)
PREFILLED_SYRINGE | INTRAVENOUS | Status: AC
  Filled 2017-01-26: qty 10

## 2017-01-26 MED ORDER — STERILE WATER FOR INJECTION IJ SOLN
INTRAMUSCULAR | Status: AC
  Filled 2017-01-26: qty 10

## 2017-01-26 MED ORDER — MIDAZOLAM HCL 5 MG/5ML IJ SOLN
INTRAMUSCULAR | Status: DC | PRN
  Administered 2017-01-26: 2 mg via INTRAVENOUS

## 2017-01-26 MED ORDER — STERILE WATER FOR IRRIGATION IR SOLN
Status: DC | PRN
  Administered 2017-01-26: 200 mL

## 2017-01-26 MED ORDER — PHENYLEPHRINE HCL 2.5 % OP SOLN
1.0000 [drp] | OPHTHALMIC | Status: AC | PRN
  Administered 2017-01-26 (×3): 1 [drp] via OPHTHALMIC
  Filled 2017-01-26: qty 2

## 2017-01-26 MED ORDER — EPINEPHRINE PF 1 MG/ML IJ SOLN
INTRAMUSCULAR | Status: AC
  Filled 2017-01-26: qty 1

## 2017-01-26 MED ORDER — PROPOFOL 10 MG/ML IV BOLUS
INTRAVENOUS | Status: AC
  Filled 2017-01-26: qty 20

## 2017-01-26 MED ORDER — LIDOCAINE HCL (PF) 1 % IJ SOLN
INTRAMUSCULAR | Status: AC
  Filled 2017-01-26: qty 30

## 2017-01-26 MED ORDER — HYPROMELLOSE (GONIOSCOPIC) 2.5 % OP SOLN
OPHTHALMIC | Status: AC
  Filled 2017-01-26: qty 15

## 2017-01-26 MED ORDER — HYPROMELLOSE (GONIOSCOPIC) 2.5 % OP SOLN
OPHTHALMIC | Status: DC | PRN
  Administered 2017-01-26: 2 [drp] via OPHTHALMIC

## 2017-01-26 MED ORDER — DEXAMETHASONE SODIUM PHOSPHATE 10 MG/ML IJ SOLN
INTRAMUSCULAR | Status: AC
  Filled 2017-01-26: qty 1

## 2017-01-26 MED ORDER — SODIUM CHLORIDE 0.9 % IV SOLN
INTRAVENOUS | Status: DC | PRN
  Administered 2017-01-26: 07:00:00 via INTRAVENOUS

## 2017-01-26 MED ORDER — OFLOXACIN 0.3 % OP SOLN
1.0000 [drp] | OPHTHALMIC | Status: AC | PRN
  Administered 2017-01-26: 1 [drp] via OPHTHALMIC
  Filled 2017-01-26: qty 5

## 2017-01-26 MED ORDER — CEFAZOLIN SUBCONJUNCTIVAL INJECTION 100 MG/0.5 ML
200.0000 mg | INJECTION | SUBCONJUNCTIVAL | Status: AC
  Administered 2017-01-26: 200 mg via SUBCONJUNCTIVAL
  Filled 2017-01-26 (×2): qty 5

## 2017-01-26 MED ORDER — LIDOCAINE HCL (CARDIAC) 20 MG/ML IV SOLN
INTRAVENOUS | Status: DC | PRN
  Administered 2017-01-26: 20 mg via INTRAVENOUS

## 2017-01-26 MED ORDER — ACETAMINOPHEN 325 MG PO TABS: 325.0000 mg | ORAL_TABLET | ORAL | Status: DC | PRN

## 2017-01-26 MED ORDER — BSS PLUS IO SOLN
INTRAOCULAR | Status: AC
  Filled 2017-01-26: qty 500

## 2017-01-26 MED ORDER — 0.9 % SODIUM CHLORIDE (POUR BTL) OPTIME
TOPICAL | Status: DC | PRN
  Administered 2017-01-26: 200 mL

## 2017-01-26 MED ORDER — BUPIVACAINE HCL (PF) 0.75 % IJ SOLN
INTRAMUSCULAR | Status: AC
  Filled 2017-01-26: qty 30

## 2017-01-26 MED ORDER — BUPIVACAINE HCL (PF) 0.75 % IJ SOLN: INTRAMUSCULAR | Status: DC | PRN

## 2017-01-26 MED ORDER — MIDAZOLAM HCL 2 MG/2ML IJ SOLN
INTRAMUSCULAR | Status: AC
  Filled 2017-01-26: qty 2

## 2017-01-26 MED ORDER — SODIUM CHLORIDE 0.9 % IV SOLN: INTRAVENOUS | Status: DC

## 2017-01-26 MED ORDER — HYALURONIDASE HUMAN 150 UNIT/ML IJ SOLN
INTRAMUSCULAR | Status: AC
  Filled 2017-01-26: qty 1

## 2017-01-26 MED ORDER — ACETAMINOPHEN 160 MG/5ML PO SOLN: 325.0000 mg | ORAL | Status: DC | PRN

## 2017-01-26 MED ORDER — LIDOCAINE 2% (20 MG/ML) 5 ML SYRINGE
INTRAMUSCULAR | Status: AC
  Filled 2017-01-26: qty 5

## 2017-01-26 MED ORDER — CHLORHEXIDINE GLUCONATE CLOTH 2 % EX PADS: 6.0000 | MEDICATED_PAD | Freq: Once | CUTANEOUS | Status: DC

## 2017-01-26 MED ORDER — TETRACAINE HCL 0.5 % OP SOLN
OPHTHALMIC | Status: AC
  Filled 2017-01-26: qty 4

## 2017-01-26 MED ORDER — DEXAMETHASONE SODIUM PHOSPHATE 10 MG/ML IJ SOLN
INTRAMUSCULAR | Status: DC | PRN
  Administered 2017-01-26: 10 mg

## 2017-01-26 MED ORDER — LIDOCAINE HCL 2 % IJ SOLN
INTRAMUSCULAR | Status: AC
  Filled 2017-01-26: qty 20

## 2017-01-26 MED ORDER — FENTANYL CITRATE (PF) 100 MCG/2ML IJ SOLN: 25.0000 ug | INTRAMUSCULAR | Status: DC | PRN

## 2017-01-26 MED ORDER — TRIAMCINOLONE ACETONIDE 40 MG/ML IJ SUSP
INTRAMUSCULAR | Status: DC | PRN
  Administered 2017-01-26: 40 mg

## 2017-01-26 MED ORDER — LIDOCAINE HCL 2 % IJ SOLN
INTRAMUSCULAR | Status: DC | PRN
  Administered 2017-01-26: 6 mL via OPHTHALMIC

## 2017-01-26 MED ORDER — PROPOFOL 10 MG/ML IV BOLUS
INTRAVENOUS | Status: DC | PRN
  Administered 2017-01-26: 50 mg via INTRAVENOUS

## 2017-01-26 MED ORDER — ATROPINE SULFATE 1 % OP SOLN
OPHTHALMIC | Status: AC
  Filled 2017-01-26: qty 5

## 2017-01-26 MED ORDER — BSS IO SOLN
INTRAOCULAR | Status: AC
  Filled 2017-01-26: qty 15

## 2017-01-26 SURGICAL SUPPLY — 66 items
APL SRG 3 HI ABS STRL LF PLS (MISCELLANEOUS)
APPLICATOR COTTON TIP 6IN STRL (MISCELLANEOUS) ×4 IMPLANT
APPLICATOR DR MATTHEWS STRL (MISCELLANEOUS) ×1 IMPLANT
BLADE MINI 60D BLUE (BLADE) ×4 IMPLANT
BLADE MINI RND TIP GREEN BEAV (BLADE) IMPLANT
CANNULA ANT CHAM MAIN (OPHTHALMIC RELATED) IMPLANT
CANNULA DUALBORE 25G (CANNULA) ×4 IMPLANT
CANNULA VLV SOFT TIP 25G (OPHTHALMIC) IMPLANT
CANNULA VLV SOFT TIP 25GA (OPHTHALMIC) ×4 IMPLANT
CANNULA VLV SOFT TIP 27G (OPHTHALMIC) IMPLANT
CANNULA VLV SOFT TIP 27GA (OPHTHALMIC) IMPLANT
CAUTERY EYE LOW TEMP 1300F FIN (OPHTHALMIC RELATED) IMPLANT
CLOSURE WOUND 1/2 X4 (GAUZE/BANDAGES/DRESSINGS) ×1
CORDS BIPOLAR (ELECTRODE) IMPLANT
COVER SURGICAL LIGHT HANDLE (MISCELLANEOUS) ×4 IMPLANT
FILTER BLUE MILLIPORE (MISCELLANEOUS) IMPLANT
FILTER STRAW FLUID ASPIR (MISCELLANEOUS) IMPLANT
GAS AUTO FILL CONSTEL (OPHTHALMIC) ×4
GAS AUTO FILL CONSTELLATION (OPHTHALMIC) ×1 IMPLANT
GAS OPHTHALMIC (MISCELLANEOUS) IMPLANT
GLOVE BIO SURGEON STRL SZ7.5 (GLOVE) ×1 IMPLANT
GLOVE ECLIPSE 7.5 STRL STRAW (GLOVE) ×3 IMPLANT
GLOVE SURG SS PI 6.5 STRL IVOR (GLOVE) ×3 IMPLANT
GLOVE SURG SS PI 8.0 STRL IVOR (GLOVE) ×3 IMPLANT
GOWN STRL REUS W/ TWL LRG LVL3 (GOWN DISPOSABLE) ×5 IMPLANT
GOWN STRL REUS W/TWL LRG LVL3 (GOWN DISPOSABLE) ×12
KIT BASIN OR (CUSTOM PROCEDURE TRAY) ×4 IMPLANT
KIT PERFLUORON PROCEDURE 5ML (MISCELLANEOUS) IMPLANT
KIT ROOM TURNOVER OR (KITS) ×4 IMPLANT
LENS BIOM SUPER VIEW SET DISP (OPHTHALMIC RELATED) ×3 IMPLANT
NDL 18GX1X1/2 (RX/OR ONLY) (NEEDLE) ×1 IMPLANT
NDL 25GX 5/8IN NON SAFETY (NEEDLE) IMPLANT
NDL FILTER BLUNT 18X1 1/2 (NEEDLE) IMPLANT
NDL HYPO 25GX1X1/2 BEV (NEEDLE) IMPLANT
NDL HYPO 30X.5 LL (NEEDLE) ×2 IMPLANT
NEEDLE 18GX1X1/2 (RX/OR ONLY) (NEEDLE) ×12 IMPLANT
NEEDLE 25GX 5/8IN NON SAFETY (NEEDLE) ×8 IMPLANT
NEEDLE FILTER BLUNT 18X 1/2SAF (NEEDLE) ×2
NEEDLE FILTER BLUNT 18X1 1/2 (NEEDLE) ×2 IMPLANT
NEEDLE HYPO 25GX1X1/2 BEV (NEEDLE) ×4 IMPLANT
NEEDLE HYPO 30X.5 LL (NEEDLE) IMPLANT
NS IRRIG 1000ML POUR BTL (IV SOLUTION) ×4 IMPLANT
PACK VITRECTOMY CUSTOM (CUSTOM PROCEDURE TRAY) ×4 IMPLANT
PAD ARMBOARD 7.5X6 YLW CONV (MISCELLANEOUS) ×5 IMPLANT
PAK VITRECTOMY PIK 25 GA (OPHTHALMIC RELATED) ×3 IMPLANT
PROBE LASER ILLUM FLEX CVD 25G (OPHTHALMIC) ×3 IMPLANT
REPL STRA BRUSH NDL (NEEDLE) IMPLANT
REPL STRA BRUSH NEEDLE (NEEDLE) IMPLANT
RESERVOIR BACK FLUSH (MISCELLANEOUS) IMPLANT
RETRACTOR IRIS FLEX 25G GRIESH (INSTRUMENTS) IMPLANT
ROLLS DENTAL (MISCELLANEOUS) IMPLANT
SET FLUID INJECTOR (SET/KITS/TRAYS/PACK) IMPLANT
SHEET MEDIUM DRAPE 40X70 STRL (DRAPES) ×7 IMPLANT
SOLUTION ANTI FOG 6CC (MISCELLANEOUS) ×3 IMPLANT
STOCKINETTE IMPERVIOUS 9X36 MD (GAUZE/BANDAGES/DRESSINGS) ×2 IMPLANT
STOPCOCK 4 WAY LG BORE MALE ST (IV SETS) IMPLANT
STRIP CLOSURE SKIN 1/2X4 (GAUZE/BANDAGES/DRESSINGS) ×2 IMPLANT
SUT ETHILON 5.0 S-24 (SUTURE) ×1 IMPLANT
SUT SILK 2 0 (SUTURE)
SUT SILK 2-0 18XBRD TIE 12 (SUTURE) ×1 IMPLANT
SUT VICRYL 7 0 TG140 8 (SUTURE) IMPLANT
SYR 20CC LL (SYRINGE) IMPLANT
SYR TB 1ML LUER SLIP (SYRINGE) ×3 IMPLANT
TOWEL OR 17X24 6PK STRL BLUE (TOWEL DISPOSABLE) ×2 IMPLANT
WATER STERILE IRR 1000ML POUR (IV SOLUTION) ×4 IMPLANT
WIPE INSTRUMENT VISIWIPE 73X73 (MISCELLANEOUS) IMPLANT

## 2017-01-26 NOTE — Anesthesia Preprocedure Evaluation (Signed)
Anesthesia Evaluation  Patient identified by MRN, date of birth, ID band Patient awake    Reviewed: Allergy & Precautions, NPO status , Patient's Chart, lab work & pertinent test results  History of Anesthesia Complications Negative for: history of anesthetic complications  Airway Mallampati: II  TM Distance: >3 FB Neck ROM: Full    Dental  (+) Teeth Intact   Pulmonary neg pulmonary ROS,    breath sounds clear to auscultation       Cardiovascular hypertension, Pt. on medications (-) angina(-) Past MI and (-) CHF  Rhythm:Regular     Neuro/Psych negative neurological ROS  negative psych ROS   GI/Hepatic negative GI ROS, Neg liver ROS,   Endo/Other  diabetes  Renal/GU ESRF and DialysisRenal diseaseDialysis 7/2 no complications, right chest catheter, left upper extremity avf     Musculoskeletal   Abdominal   Peds  Hematology  (+) anemia ,   Anesthesia Other Findings   Reproductive/Obstetrics                             Anesthesia Physical Anesthesia Plan  ASA: III  Anesthesia Plan: MAC   Post-op Pain Management:    Induction: Intravenous  PONV Risk Score and Plan: 1 and Ondansetron  Airway Management Planned: Nasal Cannula and Simple Face Mask  Additional Equipment: None  Intra-op Plan:   Post-operative Plan:   Informed Consent: I have reviewed the patients History and Physical, chart, labs and discussed the procedure including the risks, benefits and alternatives for the proposed anesthesia with the patient or authorized representative who has indicated his/her understanding and acceptance.   Dental advisory given  Plan Discussed with: CRNA and Surgeon  Anesthesia Plan Comments:         Anesthesia Quick Evaluation

## 2017-01-26 NOTE — Discharge Instructions (Signed)
DO NOT SLEEP ON BACK, THE EYE PRESSURE CAN GO UP AND CAUSE VISION LOSS   SLEEP ON SIDE WITH NOSE TO PILLOW  DURING DAY KEEP FACE DOWN.  15 MINUTES EVERY 2 HOURS IT IS OK TO LOOK STRAIGHT AHEAD (USE BATHROOM, EAT, WALK, ETC.)  

## 2017-01-26 NOTE — Transfer of Care (Signed)
Immediate Anesthesia Transfer of Care Note  Patient: Elita Quick II  Procedure(s) Performed: Procedure(s): REPAIR OF COMPLEX TRACTION RETINAL DETACHMENT LEFT EYE WITH VITRECTOMY, MEMBRANE PEEL, MAY INCLUDE GAS SILCONE OIL (Left) LASER PHOTO ABLATION left Eye (Left) AIR/FLUID EXCHANGE left Eye (Left)  Patient Location: PACU  Anesthesia Type:MAC  Level of Consciousness: awake  Airway & Oxygen Therapy: Patient Spontanous Breathing  Post-op Assessment: Report given to RN and Post -op Vital signs reviewed and stable  Post vital signs: Reviewed and stable  Last Vitals:  Vitals:   01/26/17 0624  BP: (!) 219/104  Pulse: 82  Resp: 18  Temp: 36.7 C    Last Pain:  Vitals:   01/26/17 0624  TempSrc: Oral         Complications: No apparent anesthesia complications

## 2017-01-26 NOTE — Progress Notes (Signed)
Given at 1000

## 2017-01-26 NOTE — Brief Op Note (Signed)
01/26/2017  9:39 AM  PATIENT:  Casey Preston  46 y.o. male  PRE-OPERATIVE DIAGNOSIS:  retinal detactment vitreous hemorrhage  POST-OPERATIVE DIAGNOSIS:  retinal detactment vitreous hemorrhage  PROCEDURE:  Procedure(s): REPAIR OF COMPLEX TRACTION RETINAL DETACHMENT LEFT EYE WITH VITRECTOMY (Left) LASER PHOTO ABLATION left Eye (Left) AIR/FLUID EXCHANGE left Eye (Left) MEMBRANE PEEL left eye (Left)  SURGEON:  Surgeon(s) and Role:    Jalene Mullet, MD - Primary  PHYSICIAN ASSISTANT:   ASSISTANTS: none  ANESTHESIA:  MAC, retrobulbar  EBL:  Total I/O In: 520 [P.O.:220; I.V.:300] Out: 0   BLOOD ADMINISTERED: none DRAINS: none  LOCAL MEDICATIONS USED: Lidocaine and Marcaine SPECIMEN:  none  DISPOSITION OF SPECIMEN: NA COUNTS:  x2  TOURNIQUET:  * No tourniquets in log *  DICTATION: note written in EPIC  PLAN OF CARE: Discharge Home when Stable  PATIENT DISPOSITION:  PACU hemodynamically stable   Delay start of Pharmacological VTE agent (>24hrs) due to surgical blood loss or risk of bleeding: NOT APPLICABLE

## 2017-01-27 ENCOUNTER — Encounter (HOSPITAL_COMMUNITY): Payer: Self-pay | Admitting: Ophthalmology

## 2017-01-28 ENCOUNTER — Ambulatory Visit (HOSPITAL_COMMUNITY)
Admission: RE | Admit: 2017-01-28 | Discharge: 2017-01-28 | Disposition: A | Discharge status: Home / Self Care | Payer: Medicaid Other | Source: Ambulatory Visit | Attending: Surgery | Admitting: Surgery

## 2017-01-28 ENCOUNTER — Ambulatory Visit (HOSPITAL_COMMUNITY): Payer: Medicaid Other | Admitting: Certified Registered"

## 2017-01-28 ENCOUNTER — Encounter (HOSPITAL_COMMUNITY): Payer: Self-pay

## 2017-01-28 ENCOUNTER — Encounter (HOSPITAL_COMMUNITY)
Admission: RE | Disposition: A | Discharge status: Home / Self Care | Payer: Self-pay | Source: Ambulatory Visit | Attending: Surgery

## 2017-01-28 DIAGNOSIS — Z720 Tobacco use: Secondary | ICD-10-CM | POA: Insufficient documentation

## 2017-01-28 DIAGNOSIS — N185 Chronic kidney disease, stage 5: Secondary | ICD-10-CM | POA: Insufficient documentation

## 2017-01-28 DIAGNOSIS — E1122 Type 2 diabetes mellitus with diabetic chronic kidney disease: Secondary | ICD-10-CM | POA: Diagnosis not present

## 2017-01-28 DIAGNOSIS — I120 Hypertensive chronic kidney disease with stage 5 chronic kidney disease or end stage renal disease: Secondary | ICD-10-CM | POA: Diagnosis not present

## 2017-01-28 DIAGNOSIS — Z79899 Other long term (current) drug therapy: Secondary | ICD-10-CM | POA: Diagnosis not present

## 2017-01-28 DIAGNOSIS — Z992 Dependence on renal dialysis: Secondary | ICD-10-CM | POA: Insufficient documentation

## 2017-01-28 DIAGNOSIS — Z7984 Long term (current) use of oral hypoglycemic drugs: Secondary | ICD-10-CM | POA: Diagnosis not present

## 2017-01-28 DIAGNOSIS — N186 End stage renal disease: Secondary | ICD-10-CM

## 2017-01-28 HISTORY — PX: BASCILIC VEIN TRANSPOSITION: SHX5742

## 2017-01-28 SURGERY — TRANSPOSITION, VEIN, BASILIC
Anesthesia: General | Site: Arm Upper | Laterality: Left

## 2017-01-28 MED ORDER — DEXTROSE 5 % IV SOLN
INTRAVENOUS | Status: DC | PRN
  Administered 2017-01-28: 1.5 g via INTRAVENOUS

## 2017-01-28 MED ORDER — FENTANYL CITRATE (PF) 250 MCG/5ML IJ SOLN
INTRAMUSCULAR | Status: AC
  Filled 2017-01-28: qty 5

## 2017-01-28 MED ORDER — EPHEDRINE 5 MG/ML INJ
INTRAVENOUS | Status: AC
  Filled 2017-01-28: qty 10

## 2017-01-28 MED ORDER — SODIUM CHLORIDE 0.9 % IV SOLN
INTRAVENOUS | Status: DC | PRN
  Administered 2017-01-28: 07:00:00 via INTRAVENOUS

## 2017-01-28 MED ORDER — MIDAZOLAM HCL 5 MG/5ML IJ SOLN
INTRAMUSCULAR | Status: DC | PRN
  Administered 2017-01-28: 2 mg via INTRAVENOUS

## 2017-01-28 MED ORDER — 0.9 % SODIUM CHLORIDE (POUR BTL) OPTIME
TOPICAL | Status: DC | PRN
  Administered 2017-01-28: 1000 mL

## 2017-01-28 MED ORDER — PHENYLEPHRINE 40 MCG/ML (10ML) SYRINGE FOR IV PUSH (FOR BLOOD PRESSURE SUPPORT)
PREFILLED_SYRINGE | INTRAVENOUS | Status: AC
  Filled 2017-01-28: qty 10

## 2017-01-28 MED ORDER — LIDOCAINE 2% (20 MG/ML) 5 ML SYRINGE
INTRAMUSCULAR | Status: DC | PRN
  Administered 2017-01-28: 80 mg via INTRAVENOUS

## 2017-01-28 MED ORDER — PROPOFOL 500 MG/50ML IV EMUL: INTRAVENOUS | Status: DC | PRN

## 2017-01-28 MED ORDER — PHENYLEPHRINE 40 MCG/ML (10ML) SYRINGE FOR IV PUSH (FOR BLOOD PRESSURE SUPPORT)
PREFILLED_SYRINGE | INTRAVENOUS | Status: DC | PRN
  Administered 2017-01-28 (×3): 80 ug via INTRAVENOUS

## 2017-01-28 MED ORDER — PROPOFOL 10 MG/ML IV BOLUS
INTRAVENOUS | Status: DC | PRN
  Administered 2017-01-28: 200 mg via INTRAVENOUS

## 2017-01-28 MED ORDER — SODIUM CHLORIDE 0.9 % IV SOLN
INTRAVENOUS | Status: DC | PRN
  Administered 2017-01-28: 500 mL

## 2017-01-28 MED ORDER — ONDANSETRON HCL 4 MG/2ML IJ SOLN
INTRAMUSCULAR | Status: DC | PRN
  Administered 2017-01-28: 4 mg via INTRAVENOUS

## 2017-01-28 MED ORDER — DEXAMETHASONE SODIUM PHOSPHATE 10 MG/ML IJ SOLN
INTRAMUSCULAR | Status: AC
  Filled 2017-01-28: qty 1

## 2017-01-28 MED ORDER — ONDANSETRON HCL 4 MG/2ML IJ SOLN
INTRAMUSCULAR | Status: AC
  Filled 2017-01-28: qty 2

## 2017-01-28 MED ORDER — CEFUROXIME SODIUM 1.5 G IV SOLR
INTRAVENOUS | Status: AC
  Filled 2017-01-28: qty 1.5

## 2017-01-28 MED ORDER — PROMETHAZINE HCL 25 MG/ML IJ SOLN: 6.2500 mg | INTRAMUSCULAR | Status: DC | PRN

## 2017-01-28 MED ORDER — OXYCODONE-ACETAMINOPHEN 5-325 MG PO TABS: 1.0000 | ORAL_TABLET | Freq: Four times a day (QID) | ORAL | 0 refills | Status: DC | PRN

## 2017-01-28 MED ORDER — LIDOCAINE HCL (CARDIAC) 20 MG/ML IV SOLN
INTRAVENOUS | Status: AC
  Filled 2017-01-28: qty 5

## 2017-01-28 MED ORDER — PHENYLEPHRINE HCL 10 MG/ML IJ SOLN
INTRAVENOUS | Status: DC | PRN
  Administered 2017-01-28: 45 ug/min via INTRAVENOUS

## 2017-01-28 MED ORDER — LIDOCAINE-EPINEPHRINE (PF) 1 %-1:200000 IJ SOLN
INTRAMUSCULAR | Status: AC
  Filled 2017-01-28: qty 30

## 2017-01-28 MED ORDER — MIDAZOLAM HCL 2 MG/2ML IJ SOLN
INTRAMUSCULAR | Status: AC
  Filled 2017-01-28: qty 2

## 2017-01-28 MED ORDER — FENTANYL CITRATE (PF) 100 MCG/2ML IJ SOLN
INTRAMUSCULAR | Status: DC | PRN
  Administered 2017-01-28: 25 ug via INTRAVENOUS
  Administered 2017-01-28: 75 ug via INTRAVENOUS
  Administered 2017-01-28: 25 ug via INTRAVENOUS

## 2017-01-28 MED ORDER — HYDROMORPHONE HCL 1 MG/ML IJ SOLN: 0.2500 mg | INTRAMUSCULAR | Status: DC | PRN

## 2017-01-28 MED ORDER — LIDOCAINE-EPINEPHRINE (PF) 1 %-1:200000 IJ SOLN: INTRAMUSCULAR | Status: DC | PRN

## 2017-01-28 MED ORDER — SUCCINYLCHOLINE CHLORIDE 200 MG/10ML IV SOSY
PREFILLED_SYRINGE | INTRAVENOUS | Status: AC
  Filled 2017-01-28: qty 10

## 2017-01-28 MED ORDER — DEXAMETHASONE SODIUM PHOSPHATE 4 MG/ML IJ SOLN
INTRAMUSCULAR | Status: DC | PRN
  Administered 2017-01-28: 8 mg via INTRAVENOUS

## 2017-01-28 SURGICAL SUPPLY — 35 items
ADH SKN CLS APL DERMABOND .7 (GAUZE/BANDAGES/DRESSINGS) ×1
ARMBAND PINK RESTRICT EXTREMIT (MISCELLANEOUS) ×3 IMPLANT
CANISTER SUCT 3000ML PPV (MISCELLANEOUS) ×3 IMPLANT
COVER PROBE W GEL 5X96 (DRAPES) ×3 IMPLANT
DECANTER SPIKE VIAL GLASS SM (MISCELLANEOUS) ×2 IMPLANT
DERMABOND ADVANCED (GAUZE/BANDAGES/DRESSINGS) ×2
DERMABOND ADVANCED .7 DNX12 (GAUZE/BANDAGES/DRESSINGS) ×1 IMPLANT
ELECT REM PT RETURN 9FT ADLT (ELECTROSURGICAL) ×3
ELECTRODE REM PT RTRN 9FT ADLT (ELECTROSURGICAL) ×1 IMPLANT
GLOVE BIO SURGEON STRL SZ 6.5 (GLOVE) ×2 IMPLANT
GLOVE BIO SURGEON STRL SZ7 (GLOVE) ×2 IMPLANT
GLOVE BIO SURGEON STRL SZ7.5 (GLOVE) ×3 IMPLANT
GLOVE BIO SURGEONS STRL SZ 6.5 (GLOVE) ×2
GLOVE BIOGEL PI IND STRL 6.5 (GLOVE) IMPLANT
GLOVE BIOGEL PI IND STRL 7.0 (GLOVE) IMPLANT
GLOVE BIOGEL PI INDICATOR 6.5 (GLOVE) ×2
GLOVE BIOGEL PI INDICATOR 7.0 (GLOVE) ×2
GOWN STRL REUS W/ TWL LRG LVL3 (GOWN DISPOSABLE) ×2 IMPLANT
GOWN STRL REUS W/ TWL XL LVL3 (GOWN DISPOSABLE) ×1 IMPLANT
GOWN STRL REUS W/TWL LRG LVL3 (GOWN DISPOSABLE) ×12
GOWN STRL REUS W/TWL XL LVL3 (GOWN DISPOSABLE) ×3
KIT BASIN OR (CUSTOM PROCEDURE TRAY) ×3 IMPLANT
KIT ROOM TURNOVER OR (KITS) ×3 IMPLANT
NS IRRIG 1000ML POUR BTL (IV SOLUTION) ×3 IMPLANT
PACK CV ACCESS (CUSTOM PROCEDURE TRAY) ×3 IMPLANT
PAD ARMBOARD 7.5X6 YLW CONV (MISCELLANEOUS) ×6 IMPLANT
SUT MNCRL AB 4-0 PS2 18 (SUTURE) ×5 IMPLANT
SUT PROLENE 5 0 C 1 24 (SUTURE) ×4 IMPLANT
SUT PROLENE 5 0 C 1 36 (SUTURE) ×4 IMPLANT
SUT PROLENE 6 0 BV (SUTURE) ×9 IMPLANT
SUT SILK 2 0 SH (SUTURE) ×2 IMPLANT
SUT VIC AB 3-0 SH 27 (SUTURE) ×6
SUT VIC AB 3-0 SH 27X BRD (SUTURE) ×1 IMPLANT
UNDERPAD 30X30 (UNDERPADS AND DIAPERS) ×3 IMPLANT
WATER STERILE IRR 1000ML POUR (IV SOLUTION) ×3 IMPLANT

## 2017-01-28 NOTE — Anesthesia Procedure Notes (Signed)
Procedure Name: LMA Insertion Date/Time: 01/28/2017 7:36 AM Performed by: Orlie Dakin Pre-anesthesia Checklist: Patient identified, Emergency Drugs available, Suction available, Patient being monitored and Timeout performed Patient Re-evaluated:Patient Re-evaluated prior to inductionOxygen Delivery Method: Circle system utilized Preoxygenation: Pre-oxygenation with 100% oxygen Intubation Type: IV induction Ventilation: Mask ventilation without difficulty LMA: LMA inserted LMA Size: 4.0 Number of attempts: 1 Placement Confirmation: positive ETCO2 and breath sounds checked- equal and bilateral Tube secured with: Tape Dental Injury: Teeth and Oropharynx as per pre-operative assessment

## 2017-01-28 NOTE — Anesthesia Preprocedure Evaluation (Addendum)
Anesthesia Evaluation  Patient identified by MRN, date of birth, ID band Patient awake    Reviewed: Allergy & Precautions, NPO status , Patient's Chart, lab work & pertinent test results  Airway Mallampati: II   Neck ROM: full    Dental   Pulmonary    breath sounds clear to auscultation       Cardiovascular hypertension,  Rhythm:regular Rate:Normal     Neuro/Psych    GI/Hepatic   Endo/Other  diabetes  Renal/GU Renal disease     Musculoskeletal   Abdominal   Peds  Hematology  (+) anemia ,   Anesthesia Other Findings   Reproductive/Obstetrics                            Anesthesia Physical Anesthesia Plan  ASA: III  Anesthesia Plan: General   Post-op Pain Management:    Induction: Intravenous  PONV Risk Score and Plan: 3 and Ondansetron, Dexamethasone, Propofol and Midazolam  Airway Management Planned: LMA  Additional Equipment:   Intra-op Plan:   Post-operative Plan: Extubation in OR  Informed Consent: I have reviewed the patients History and Physical, chart, labs and discussed the procedure including the risks, benefits and alternatives for the proposed anesthesia with the patient or authorized representative who has indicated his/her understanding and acceptance.   Dental advisory given  Plan Discussed with: CRNA and Surgeon  Anesthesia Plan Comments:         Anesthesia Quick Evaluation

## 2017-01-28 NOTE — H&P (Signed)
   History and Physical Update  The patient was interviewed and re-examined.  The patient's previous History and Physical has been reviewed and is unchanged from Dr. Trula Slade recent office visit. Reiterated the risks of nerve injury, imn, steal and he agrees to proceed.   Casey Juhasz [name], MD [facility] [place] Office: [phone] Pager: [phone]   01/28/2017, 7:14 AM

## 2017-01-28 NOTE — Op Note (Signed)
    Patient name: Casey Preston MRN: [id number] DOB: 01/03/1971 Sex: male  01/28/2017 Pre-operative Diagnosis: esrd Post-operative diagnosis:  Same Surgeon:  Erlene Quan [name], MD Assistant: Leontine Locket, PA Procedure Performed: Left second stage basilic vein transposition av fistula  Indications:  46 year old male on dialysis via right IJ tunneled catheter has a previous left first stage basilic vein transposition fistula. He is now indicated for the second stage.  Findings: Medial cutaneous nerve was intimately involved with the basilic vein. After tunneling and primary anastomosis there was a strong thrill in runoff vein and palpable radial pulse at the level of the wrist.   Procedure:  The patient was identified in the holding area and taken to the operating room where he was placed supine on the operating table and LMA anesthesia was induced. He was given antibiotics sterilely prepped and draped in his left arm timeout called. Following this his ultrasound identify the basilic vein and its branches. Then made an incision above the antecubitum dissected down onto the vein protecting the medial cutaneous nerve. We dissected out and divided branches between ties and clips. Then made a counter incision towards the axilla and further dissected out the vein. It was essentially fused to the deep system near the axilla this was clamped on either side and oversewn with 5-0 Prolene on both the deep and basilic vein side. We then clamped clamped the basilic vein with bulldog clamp in the axilla and inflow and divided near the antecubitum. It was flushed with heparinized saline and marked for orientation and then tunneled subcutaneously. It was then sewn end and after being flushed again with 2 6-0 Prolene sutures. Prior to completion we did flush antegrade and retrograde. On completion of the strong thrill in runoff vein palpable radial pulse. Satisfied with this we obtained hemostasis in all wounds and  closed in 2 layers with Vicryl and Monocryl suture. Patient was allowed awaken from anesthesia having tolerated the procedure well without immediate competition. All counts were correct at completion.  Blood loss 25 mL.     Dorette Hartel [name], MD [facility] Office: [phone] Pager: [phone]

## 2017-01-28 NOTE — Transfer of Care (Signed)
Immediate Anesthesia Transfer of Care Note  Patient: Casey Preston II  Procedure(s) Performed: Procedure(s): BASCILIC VEIN TRANSPOSITION-LEFT 2ND STAGE (Left)  Patient Location: PACU  Anesthesia Type:General  Level of Consciousness: drowsy  Airway & Oxygen Therapy: Patient Spontanous Breathing and Patient connected to face mask oxygen  Post-op Assessment: Report given to RN and Post -op Vital signs reviewed and stable  Post vital signs: Reviewed and stable  Last Vitals:  Vitals:   01/28/17 0602 01/28/17 0603  BP: (!) 214/101 (!) 200/101  Pulse: 79   Resp: 18   Temp: 37.2 C     Last Pain:  Vitals:   01/28/17 0602  TempSrc: Oral  PainSc:       Patients Stated Pain Goal: 0 (46/50/35 4656)  Complications: No apparent anesthesia complications

## 2017-01-28 NOTE — Discharge Instructions (Signed)
° ° °  01/28/2017 CARLA WHILDEN II [id number] July 02, 1971  Surgeon(s): Waynetta Sandy, MD  Procedure(s): BASILIC VEIN TRANSPOSITION-LEFT 2ND STAGE  x Do not stick fistula for 12 weeks

## 2017-01-29 ENCOUNTER — Telehealth: Payer: Self-pay | Admitting: Vascular Surgery

## 2017-01-29 ENCOUNTER — Encounter (HOSPITAL_COMMUNITY): Payer: Self-pay | Admitting: Vascular Surgery

## 2017-01-29 NOTE — Anesthesia Postprocedure Evaluation (Signed)
Anesthesia Post Note  Patient: Casey Preston  Procedure(s) Performed: Procedure(s) (LRB): BASCILIC VEIN TRANSPOSITION-LEFT 2ND STAGE (Left)     Patient location during evaluation: PACU Anesthesia Type: General Level of consciousness: awake and alert and patient cooperative Pain management: pain level controlled Vital Signs Assessment: post-procedure vital signs reviewed and stable Respiratory status: spontaneous breathing and respiratory function stable Cardiovascular status: stable Anesthetic complications: no    Last Vitals:  Vitals:   01/28/17 0930 01/28/17 0950  BP:  (!) 163/94  Pulse: 83   Resp: 17 12  Temp:  36.7 C    Last Pain:  Vitals:   01/28/17 0950  TempSrc:   PainSc: 0-No pain                 Tristian Sickinger S

## 2017-01-29 NOTE — Telephone Encounter (Signed)
Sched appt 02/24/17 at 1:15. Spoke to pt.

## 2017-01-29 NOTE — Anesthesia Postprocedure Evaluation (Signed)
Anesthesia Post Note  Patient: Casey Preston  Procedure(s) Performed: Procedure(s) (LRB): REPAIR OF COMPLEX TRACTION RETINAL DETACHMENT LEFT EYE WITH VITRECTOMY (Left) LASER PHOTO ABLATION left Eye (Left) AIR/FLUID EXCHANGE left Eye (Left) MEMBRANE PEEL left eye (Left)     Patient location during evaluation: PACU Anesthesia Type: MAC Level of consciousness: awake and alert Pain management: pain level controlled Vital Signs Assessment: post-procedure vital signs reviewed and stable Respiratory status: spontaneous breathing, nonlabored ventilation, respiratory function stable and patient connected to nasal cannula oxygen Cardiovascular status: stable and blood pressure returned to baseline Anesthetic complications: no    Last Vitals:  Vitals:   01/26/17 0927 01/26/17 0933  BP: (!) 168/97 (!) 163/93  Pulse: 81 80  Resp: 16 16  Temp: 36.5 C     Last Pain:  Vitals:   01/26/17 0933  TempSrc:   PainSc: 0-No pain                 Hilda Wexler

## 2017-01-29 NOTE — Telephone Encounter (Signed)
-----   Message from Denman George, RN sent at 01/28/2017  9:53 AM EDT ----- Regarding: needs 3-4 week f/u with Dr. Donzetta Matters; no duplex needed   ----- Message ----- From: Gabriel Earing, PA-C Sent: 01/28/2017   8:57 AM To: Vvs Charge Pool  S/p 2nd stage left BVT 01/28/17.  F/u with Dr. Donzetta Matters in 3-4 weeks.  Does not need duplex.  Thanks

## 2017-02-15 ENCOUNTER — Encounter: Payer: Self-pay | Admitting: Vascular Surgery

## 2017-02-24 ENCOUNTER — Ambulatory Visit (INDEPENDENT_AMBULATORY_CARE_PROVIDER_SITE_OTHER): Payer: Self-pay | Admitting: Physician Assistant

## 2017-02-24 ENCOUNTER — Encounter: Payer: Self-pay | Admitting: Vascular Surgery

## 2017-02-24 VITALS — BP 127/75 | HR 84 | Temp 99.1°F | Resp 16 | Ht 69.5 in | Wt 153.0 lb

## 2017-02-24 DIAGNOSIS — N186 End stage renal disease: Secondary | ICD-10-CM

## 2017-02-24 DIAGNOSIS — Z992 Dependence on renal dialysis: Secondary | ICD-10-CM

## 2017-02-24 NOTE — Progress Notes (Signed)
  POST OPERATIVE OFFICE NOTE    CC:  F/u for surgery  HPI:  This is a 46 y.o. male who is s/p second stage left brachiobasilic  av fistula creation with transposition 01/28/2017.  First stage was performed on 12/03/2016.  He reports no numbness, weakness or pain in his left UE.  He is currently on HD via right tunneled catheter.  He reports better BP control and is now on one medication down from several.  No new health issues since his surgery.  Allergies  Allergen Reactions  . No Known Allergies     Current Outpatient Prescriptions  Medication Sig Dispense Refill  . amLODipine (NORVASC) 10 MG tablet Take 10 mg by mouth daily at 2 PM.    . oxyCODONE-acetaminophen (ROXICET) 5-325 MG tablet Take 1 tablet by mouth every 6 (six) hours as needed for severe pain. 10 tablet 0   No current facility-administered medications for this visit.      ROS:  See HPI  Physical Exam:  Vitals:   02/24/17 1348  BP: 127/75  Pulse: 84  Resp: 16  Temp: 99.1 F (37.3 C)    Incision:  Well healed with palpable thrill throughout the fistula.   Extremities:  Sensation, grip 5/5 and palpable radial pulse. Lungs CTA Heart RRR  Assessment/Plan:  This is a 46 y.o. male who is s/p: Left av fistula brachiobasilic transposition.  He is now 3 month s/p creation of the fistula.  The fistula can now be utilized for HD.  Once it is functioning fully he can be seen for HD catheter removal.    He is pleased with his surgical out come.   Laurence Slate [facility]  PA-C [facility] [phone]  Clinic MD:  Donzetta Matters

## 2017-02-24 NOTE — H&P (Signed)
Date of examination:  01/21/17  Indication for surgery: Vitreous hemorrhage and tractional retinal detachment left eye  Pertinent past medical history:  Past Medical History:  Diagnosis Date  . Anemia   . 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 Diabetes mellitus without complication ([place])    not on medication- Per kidney biospy  . ESRD (end stage renal disease) ([place])    MWF  . Hypertension   . 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  Pertinent family history:  Family History  Problem Relation Age of Onset  . Diabetes Mother   . Diabetes Father     General:  Healthy appearing patient in no distress  Eyes:    Acuity OD 20/40  OS CF  [place]  External: Within normal limits     Anterior segment: Within normal limits     Motility:     Fundus: peripheral laser scars OD, vitreous hemorrhage  B-scan - TRD left eye     Impression: Following careful peripheral vitrectomy, attention was directed to the endolaser portion of the procedure.   Plan: Tractional retinal detachment repair left eye.  Royston Cowper

## 2017-02-24 NOTE — Op Note (Signed)
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  A lid speculum was placed.  Infusion line and trocar was placed at the 4 o'clock position approximately 3.5 mm from the surgical limbus.   The infusion line was allowed to run and then clamped when placed at the cannula opening. The line was inserted and secured to the drape with an adhesive strip.   Active trocars/cannula were placed at the 10 and 2 o'clock positions approximately 3.5 mm from the surgical limbus. The cannula was visualized in the vitreous cavity.  The light pipe and vitreous cutter were inserted into the vitreous cavity and a core vitrectomy was performed taking care to limit any traction on the macula.  The areas of traction were carefully segmented and delaminated.  Overlying membranes were removed.  Kenalog was used to highlight the areas of traction and to limit any elevation of the tractional areas.  After careful segementation and delamination, a posterior vitreous detachment was induced and core vitrectomy completed.  Care taken to remove the vitreous up to the vitreous base for 360 degrees with the aid of scleral depression.  There was notable gliosis and tractional membranes along the arcades.  ILM forceps were used to remove these membranes.  After a careful membrancectomy of the gliotic membranes, the vitreous was carefully shaved over the peripheral retina.  A complete air/fluid exchange was performed.    Endolaser was applied 360 degrees and surrounding the areas of gliosis with the aid of scleral indentation to ensure adequate  support over the treated areas.    12% C3F8 gas was placed in the eye.  The trocars were sequentially removed and noted to be air tight.  Normal intraocular pressure was noted by digital palpapation.  Subconjunctival injection of Ancef and Decadron were placed.   The speculum and drapes were removed and the eye was patched with Polymixin/Bacitracin ophthalmic ointment. An eye shield was placed and the patient was transferred alert and conversant with stable vital signs to the post operative recovery area.  The patient tolerated the procedure well and no complications were noted.

## 2017-03-18 NOTE — Addendum Note (Signed)
Addendum  created 03/18/17 1356 by Roberts Gaudy, MD   Sign clinical note

## 2017-10-28 IMAGING — US US BIOPSY
1 series · 9 of 9 positions shown · non-contrast
Comparison: none

INDICATION: Acute renal failure

[Series 1: us biopsy · 0.26mm/px · 9 of 9 slices shown]
[im 1/9]
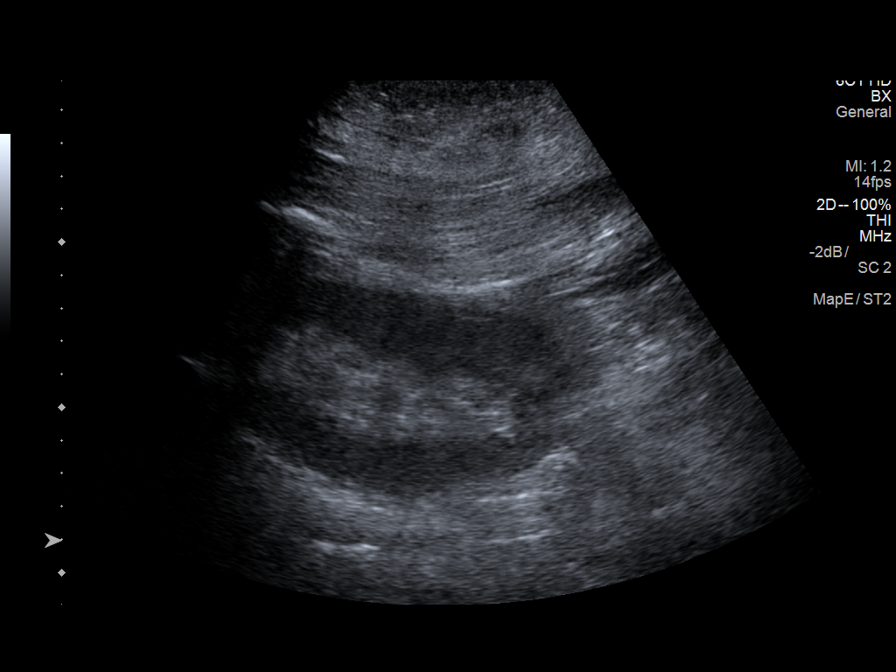
[im 2/9]
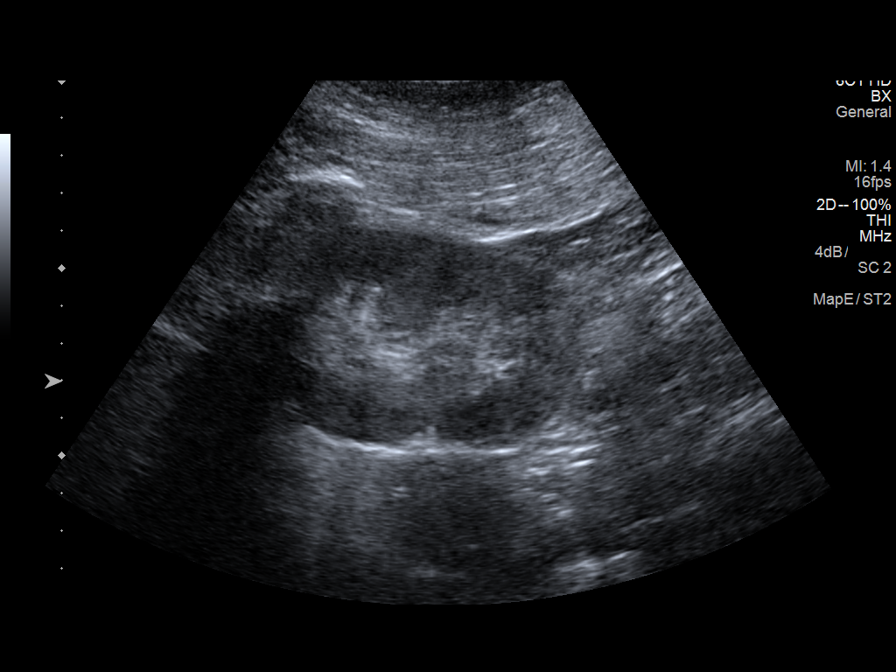
[im 3/9]
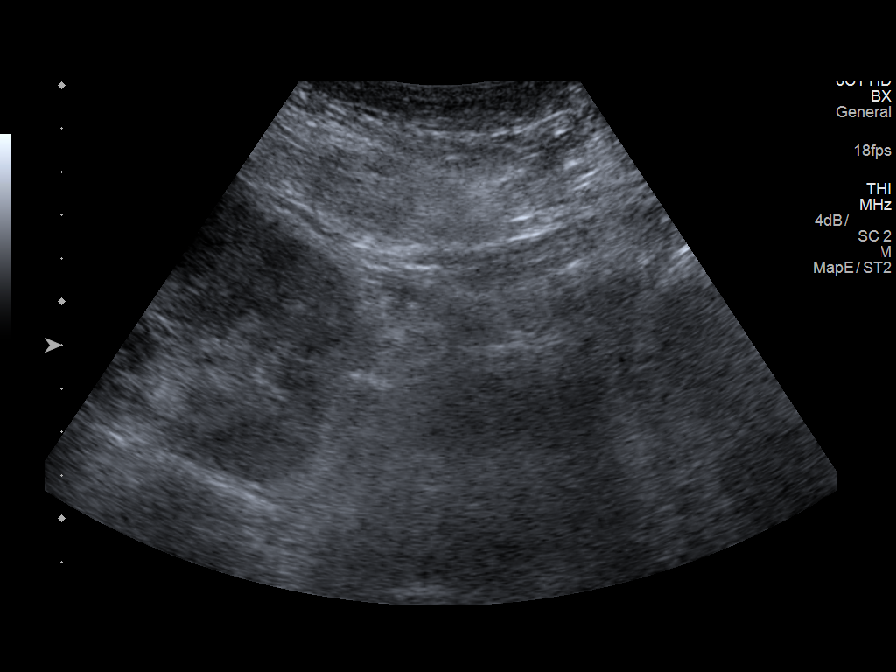
[im 4/9]
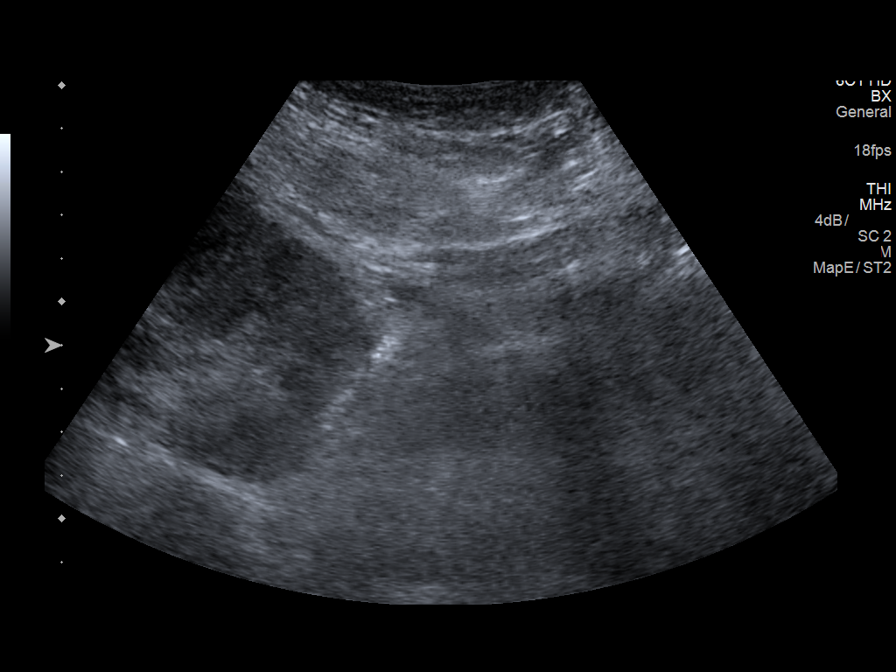
[im 5/9]
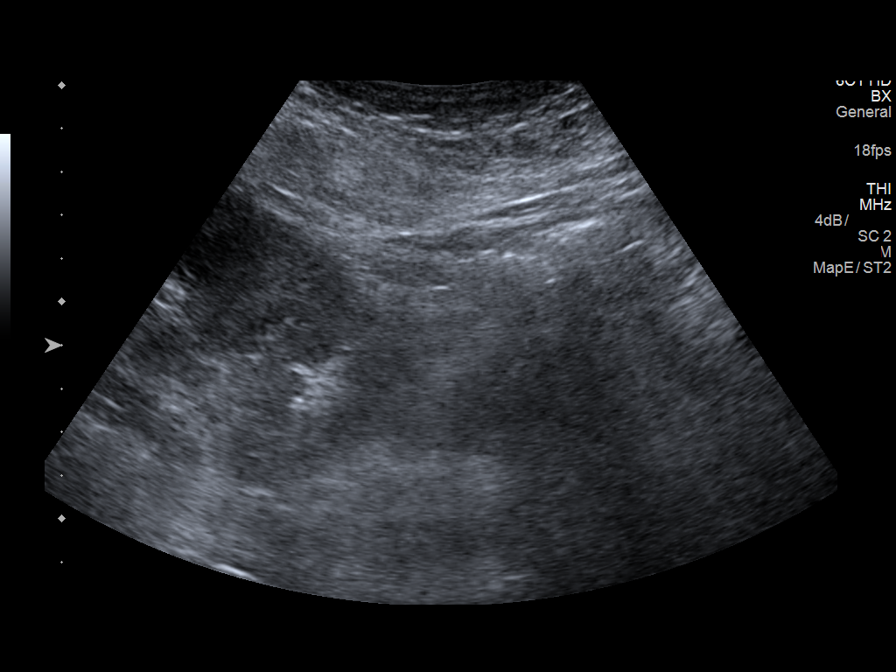
[im 6/9]
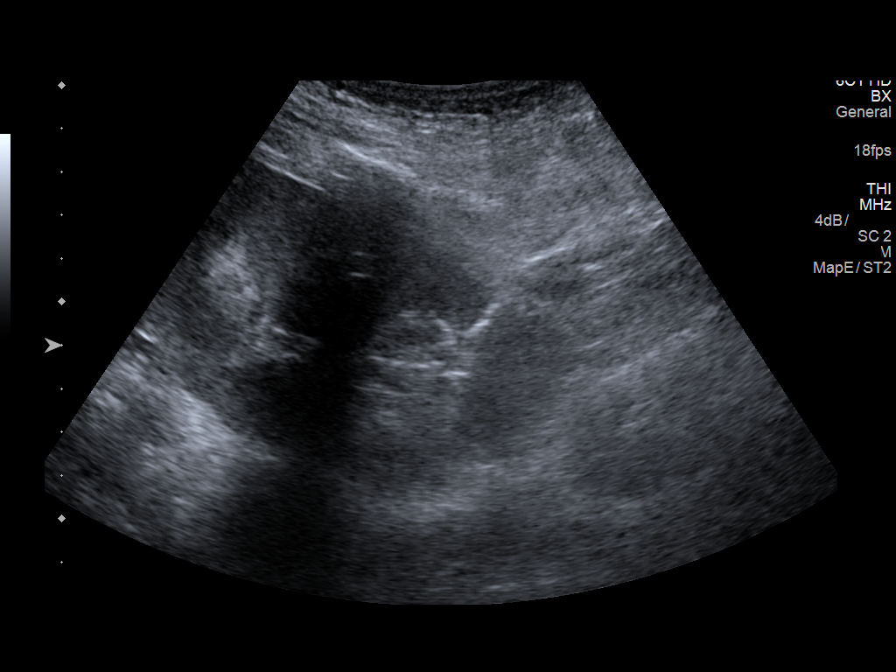
[im 7/9]
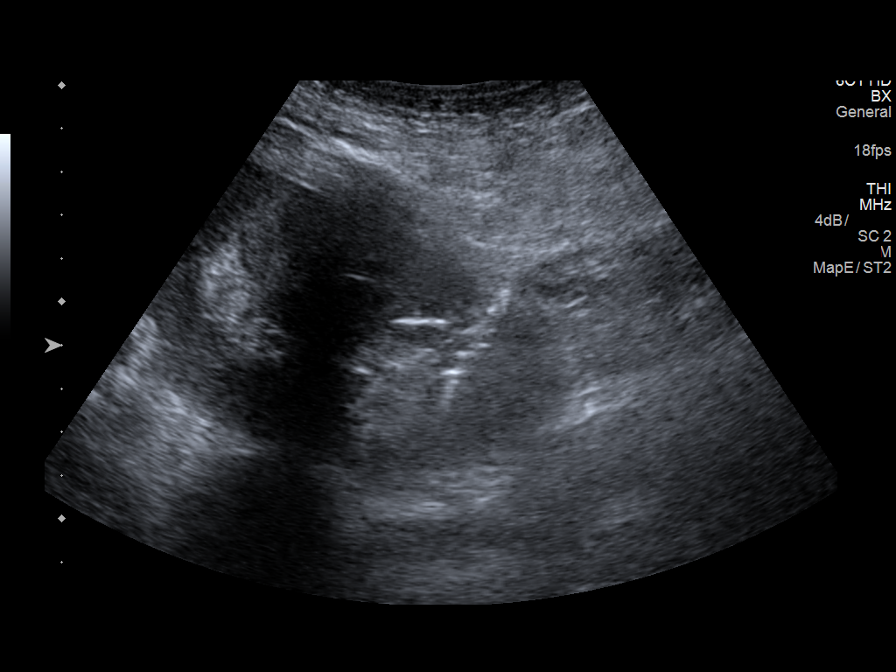
[im 8/9]
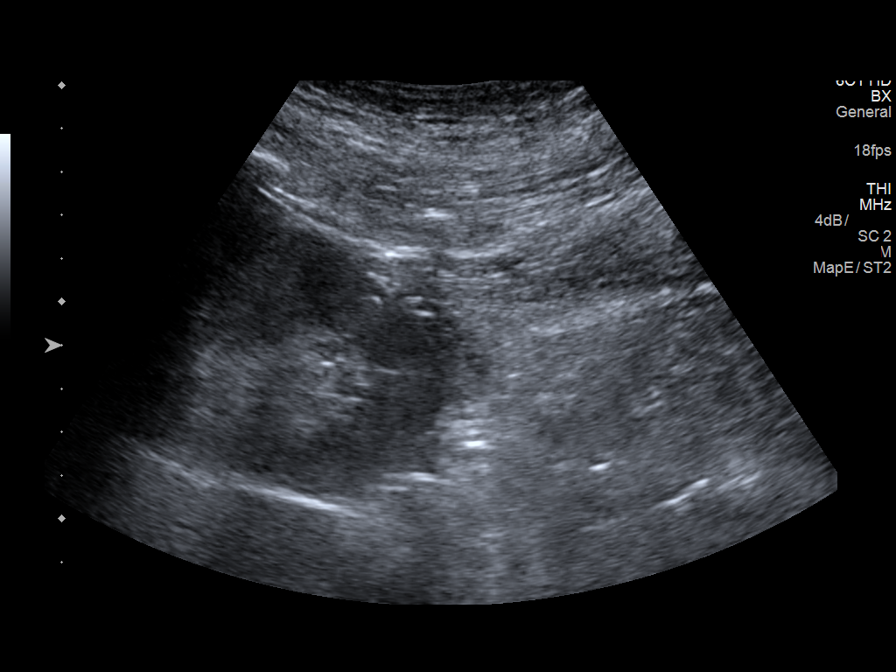
[im 9/9]
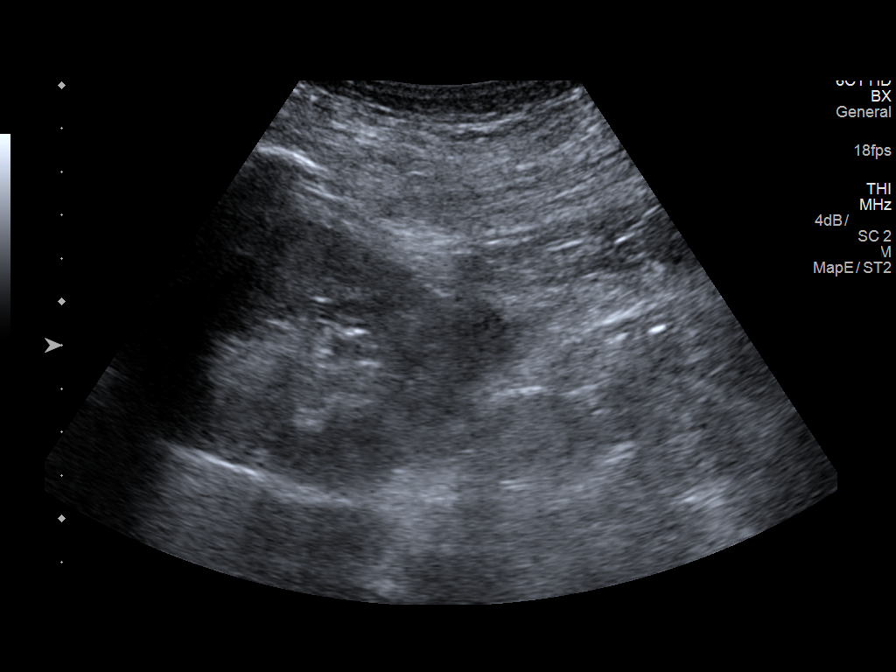

[9 of 9 positions shown; findings below may reference images not displayed]

EXAM:
ULTRASOUND-GUIDED RANDOM RENAL CORTEX CORE BIOPSY

MEDICATIONS:
None.

ANESTHESIA/SEDATION:
Fentanyl 50 mcg IV; Versed 1 mg IV

Moderate Sedation Time:  10

The patient was continuously monitored during the procedure by the
interventional radiology nurse under my direct supervision.

FLUOROSCOPY TIME:  None.

COMPLICATIONS:
Tiny perinephric hematoma

PROCEDURE:
Informed written consent was obtained from the patient after a
thorough discussion of the procedural risks, benefits and
alternatives. All questions were addressed. Maximal Sterile Barrier
Technique was utilized including caps, mask, sterile gowns, sterile
gloves, sterile drape, hand hygiene and skin antiseptic. A timeout
was performed prior to the initiation of the procedure.

The back was prepped with ChloraPrep in a sterile fashion, and a
sterile drape was applied covering the operative field. A sterile
gown and sterile gloves were used for the procedure.

Under sonographic guidance, 2 16 gauge core biopsies from the cortex
of the lower pole of the left kidney were obtain. Final imaging was
performed.

Patient tolerated the procedure well without complication. Vital
sign monitoring by nursing staff during the procedure will continue
as patient is in the special procedures unit for post procedure
observation.
FINDINGS: The images document guide needle placement within the lower pole
cortex of the left kidney. Post biopsy images demonstrate tiny
adjacent perinephric hematoma.
IMPRESSION: Successful ultrasound-guided core biopsy of the left renal lower
pole cortex.

## 2017-11-19 DIAGNOSIS — E46 Unspecified protein-calorie malnutrition: Secondary | ICD-10-CM | POA: Insufficient documentation

## 2018-02-10 DIAGNOSIS — I208 Other forms of angina pectoris: Secondary | ICD-10-CM | POA: Insufficient documentation

## 2018-03-21 IMAGING — DX DG CHEST 1V PORT
1 series · 1 of 1 positions shown · non-contrast
Comparison: 07/14/2016

CLINICAL DATA: Status post dialysis catheter placement

EXAM:
PORTABLE CHEST 1 VIEW

[chest ap]
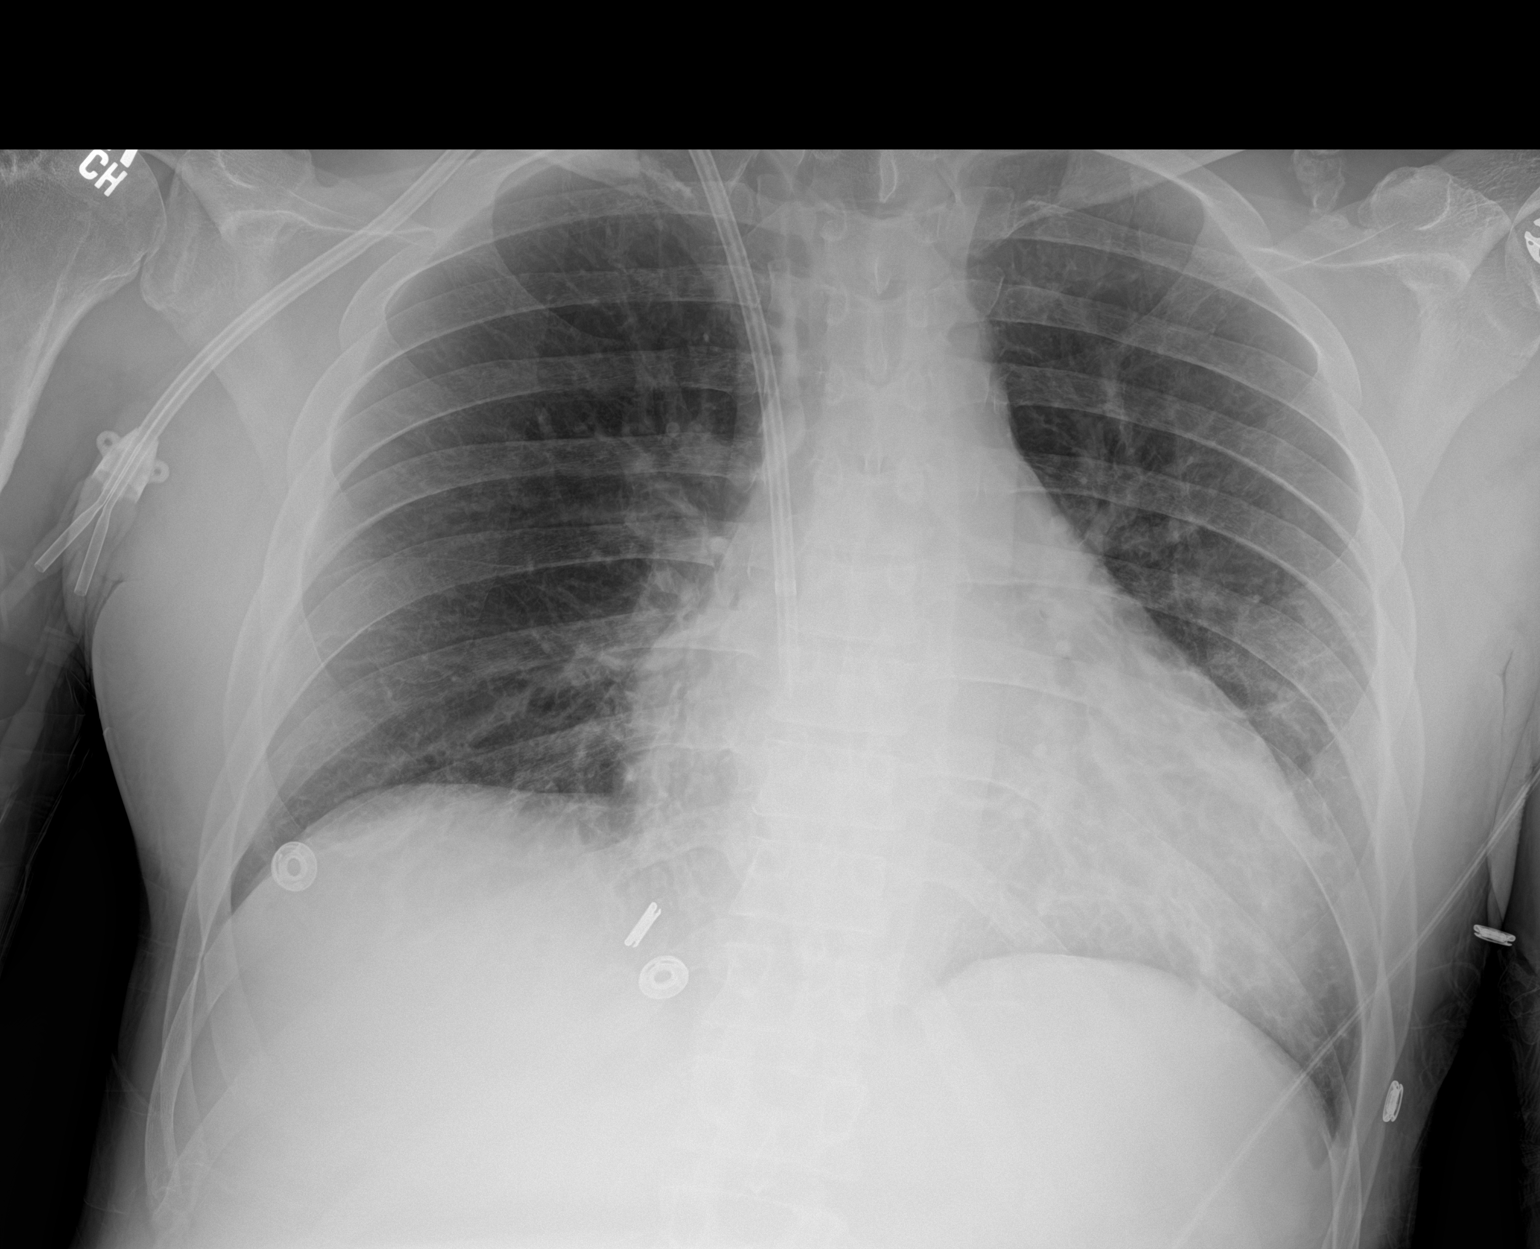

[1 of 1 positions shown; findings below may reference images not displayed]

FINDINGS: Cardiac shadow is mildly enlarged. A new right jugular dialysis
catheter is seen in appropriate position. No pneumothorax is noted.
Focal infiltrate is seen. Some patchy scarring is noted in the left
base similar to that noted on the prior exam. No acute bony
abnormality is seen.
IMPRESSION: No pneumothorax following dialysis catheter placement.

Patchy scarring in the left lung base.

## 2018-07-01 DIAGNOSIS — D509 Iron deficiency anemia, unspecified: Secondary | ICD-10-CM | POA: Insufficient documentation

## 2018-07-06 DIAGNOSIS — Z4802 Encounter for removal of sutures: Secondary | ICD-10-CM | POA: Insufficient documentation

## 2019-05-05 DIAGNOSIS — T782XXS Anaphylactic shock, unspecified, sequela: Secondary | ICD-10-CM | POA: Insufficient documentation

## 2019-06-17 ENCOUNTER — Emergency Department (HOSPITAL_BASED_OUTPATIENT_CLINIC_OR_DEPARTMENT_OTHER)
Admission: EM | Admit: 2019-06-17 | Discharge: 2019-06-17 | Disposition: A | Discharge status: Home / Self Care | Payer: Medicare Other | Attending: Emergency Medicine | Admitting: Emergency Medicine

## 2019-06-17 ENCOUNTER — Other Ambulatory Visit: Payer: Self-pay

## 2019-06-17 ENCOUNTER — Encounter (HOSPITAL_BASED_OUTPATIENT_CLINIC_OR_DEPARTMENT_OTHER): Payer: Self-pay | Admitting: Emergency Medicine

## 2019-06-17 DIAGNOSIS — Z23 Encounter for immunization: Secondary | ICD-10-CM | POA: Diagnosis not present

## 2019-06-17 DIAGNOSIS — Y929 Unspecified place or not applicable: Secondary | ICD-10-CM | POA: Insufficient documentation

## 2019-06-17 DIAGNOSIS — Z992 Dependence on renal dialysis: Secondary | ICD-10-CM | POA: Diagnosis not present

## 2019-06-17 DIAGNOSIS — S01332A Puncture wound without foreign body of left ear, initial encounter: Secondary | ICD-10-CM | POA: Diagnosis not present

## 2019-06-17 DIAGNOSIS — E1122 Type 2 diabetes mellitus with diabetic chronic kidney disease: Secondary | ICD-10-CM | POA: Insufficient documentation

## 2019-06-17 DIAGNOSIS — I120 Hypertensive chronic kidney disease with stage 5 chronic kidney disease or end stage renal disease: Secondary | ICD-10-CM | POA: Insufficient documentation

## 2019-06-17 DIAGNOSIS — S09302A Unspecified injury of left middle and inner ear, initial encounter: Secondary | ICD-10-CM | POA: Diagnosis present

## 2019-06-17 DIAGNOSIS — R234 Changes in skin texture: Secondary | ICD-10-CM | POA: Insufficient documentation

## 2019-06-17 DIAGNOSIS — L089 Local infection of the skin and subcutaneous tissue, unspecified: Secondary | ICD-10-CM | POA: Insufficient documentation

## 2019-06-17 DIAGNOSIS — X58XXXA Exposure to other specified factors, initial encounter: Secondary | ICD-10-CM | POA: Diagnosis not present

## 2019-06-17 DIAGNOSIS — Z79899 Other long term (current) drug therapy: Secondary | ICD-10-CM | POA: Diagnosis not present

## 2019-06-17 DIAGNOSIS — Y999 Unspecified external cause status: Secondary | ICD-10-CM | POA: Insufficient documentation

## 2019-06-17 DIAGNOSIS — Y9389 Activity, other specified: Secondary | ICD-10-CM | POA: Diagnosis not present

## 2019-06-17 DIAGNOSIS — F17220 Nicotine dependence, chewing tobacco, uncomplicated: Secondary | ICD-10-CM | POA: Insufficient documentation

## 2019-06-17 DIAGNOSIS — N186 End stage renal disease: Secondary | ICD-10-CM | POA: Diagnosis not present

## 2019-06-17 MED ORDER — ACETAMINOPHEN 500 MG PO TABS
1000.0000 mg | ORAL_TABLET | Freq: Once | ORAL | Status: AC
  Administered 2019-06-17: 1000 mg via ORAL
  Filled 2019-06-17: qty 2

## 2019-06-17 MED ORDER — CEPHALEXIN 250 MG PO CAPS
500.0000 mg | ORAL_CAPSULE | Freq: Once | ORAL | Status: AC
  Administered 2019-06-17: 04:00:00 500 mg via ORAL
  Filled 2019-06-17: qty 2

## 2019-06-17 MED ORDER — DOXYCYCLINE HYCLATE 100 MG PO CAPS: 100.0000 mg | ORAL_CAPSULE | Freq: Two times a day (BID) | ORAL | 0 refills | Status: DC

## 2019-06-17 MED ORDER — CEPHALEXIN 500 MG PO CAPS
500.0000 mg | ORAL_CAPSULE | Freq: Four times a day (QID) | ORAL | 0 refills | Status: DC
Start: 2019-06-17 — End: 2020-05-10

## 2019-06-17 MED ORDER — BACITRACIN ZINC 500 UNIT/GM EX OINT
TOPICAL_OINTMENT | Freq: Two times a day (BID) | CUTANEOUS | Status: DC
  Administered 2019-06-17: 04:00:00 via TOPICAL
  Filled 2019-06-17: qty 28.35

## 2019-06-17 MED ORDER — TETANUS-DIPHTH-ACELL PERTUSSIS 5-2.5-18.5 LF-MCG/0.5 IM SUSP
0.5000 mL | Freq: Once | INTRAMUSCULAR | Status: AC
  Administered 2019-06-17: 0.5 mL via INTRAMUSCULAR
  Filled 2019-06-17: qty 0.5

## 2019-06-17 MED ORDER — DOXYCYCLINE HYCLATE 100 MG PO TABS
100.0000 mg | ORAL_TABLET | Freq: Once | ORAL | Status: AC
Start: 2019-06-17 — End: 2019-06-17
  Administered 2019-06-17: 04:00:00 100 mg via ORAL
  Filled 2019-06-17: qty 1

## 2019-06-17 MED ORDER — MUPIROCIN CALCIUM 2 % EX CREA: 1.0000 "application " | TOPICAL_CREAM | Freq: Two times a day (BID) | CUTANEOUS | 0 refills | Status: DC

## 2019-06-17 NOTE — ED Triage Notes (Signed)
Pt c/o knot behind left ear.

## 2019-06-17 NOTE — ED Provider Notes (Signed)
[facility] EMERGENCY DEPARTMENT Provider Note   CSN: [id number] Arrival date & time: 06/17/19  [id number]     History   Chief Complaint Chief Complaint  Patient presents with  . Cyst    HPI Casey Preston is a 48 y.o. male.     The history is provided by the patient.  Illness Location:  Behind left ear, "bump for one day".  New piercing in that ear Quality:  Swollen.Also has a scab 1 inch below that spot.   Severity:  Moderate Onset quality:  Gradual Timing:  Constant Progression:  Worsening Chronicity:  New Context:  ESRD with a new piercing Relieved by:  Nothing Worsened by:  Time Ineffective treatments:  None tried Associated symptoms: no abdominal pain, no chest pain, no congestion, no cough, no diarrhea, no fatigue, no fever, no headaches, no loss of consciousness, no myalgias, no nausea, no rash, no rhinorrhea, no shortness of breath, no sore throat, no vomiting and no wheezing   Risk factors:  ESRD   Past Medical History:  Diagnosis Date  . Anemia   . 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 Diabetes mellitus without complication ([place])    not on medication- Per kidney biospy  . ESRD (end stage renal disease) ([place])    MWF  . Hypertension   . Pneumonia 06/2016    Patient Active Problem List   Diagnosis Date Noted  . Diabetes mellitus type 2, diet-controlled ([place]) 07/14/2016  . Essential hypertension 07/14/2016  . Anemia 07/14/2016  . Thrombocytopenia ([place]) 07/14/2016  . Multifocal pneumonia 07/14/2016  . Acute renal failure ([place]) 07/14/2016  . DIABETES MELLITUS Preston, UNCOMPLICATED [id number]  . OBESITY, NOS 09/23/2006  . DRUG DEPENDENCE 09/23/2006  . HYPERTENSION, BENIGN SYSTEMIC 09/23/2006    Past Surgical History:  Procedure Laterality Date  . AIR/FLUID EXCHANGE Left 01/26/2017   Procedure: AIR/FLUID EXCHANGE left Eye;  Surgeon: Jalene Mullet, MD;  Location: [facility];  Service: Ophthalmology;  Laterality: Left;  . BASCILIC  VEIN TRANSPOSITION Left 12/03/2016   Procedure: BASCILIC VEIN TRANSPOSITION-LEFT 1ST STAGE;  Surgeon: Serafina Mitchell, MD;  Location: [facility];  Service: Vascular;  Laterality: Left;  . BASCILIC VEIN TRANSPOSITION Left 01/28/2017   Procedure: BASCILIC VEIN TRANSPOSITION-LEFT 2ND STAGE;  Surgeon: Waynetta Sandy, MD;  Location: [facility];  Service: Vascular;  Laterality: Left;  . COLONOSCOPY    . INSERTION OF DIALYSIS CATHETER Right 12/03/2016   Procedure: INSERTION OF DIALYSIS CATHETER;  Surgeon: Serafina Mitchell, MD;  Location: [place];  Service: Vascular;  Laterality: Right;  . LASER PHOTO ABLATION Left 01/26/2017   Procedure: LASER PHOTO ABLATION left Eye;  Surgeon: Jalene Mullet, MD;  Location: [facility];  Service: Ophthalmology;  Laterality: Left;  . MEMBRANE PEEL Left 01/26/2017   Procedure: MEMBRANE PEEL left eye;  Surgeon: Jalene Mullet, MD;  Location: [facility];  Service: Ophthalmology;  Laterality: Left;  . RENAL BIOPSY Left   . 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 11/26/2016   Procedure: REPAIR OF COMPLEX TRACTION RETINAL DETACHMENT;  Surgeon: Jalene Mullet, MD;  Location: [facility];  Service: Ophthalmology;  Laterality: Right;  . REPAIR OF COMPLEX TRACTION RETINAL DETACHMENT Left 01/26/2017   Procedure: REPAIR OF COMPLEX TRACTION RETINAL DETACHMENT LEFT EYE WITH VITRECTOMY;  Surgeon: Jalene Mullet, MD;  Location: [facility];  Service: Ophthalmology;  Laterality: Left;        Home Medications    Prior to Admission medications   Medication Sig  Start Date End Date Taking? Authorizing Provider  amLODipine (NORVASC) 10 MG tablet Take 10 mg by mouth daily at 2 PM.    [provider]  cephALEXin (KEFLEX) 500 MG capsule Take 1 capsule (500 mg total) by mouth 4 (four) times daily. 06/17/19   Fayetta Sorenson, MD  doxycycline (VIBRAMYCIN) 100 MG capsule Take 1 capsule (100 mg total) by mouth 2 (two) times daily. One po bid x 7 days 06/17/19   Isidra Mings, MD  mupirocin cream  (BACTROBAN) 2 % Apply 1 application topically 2 (two) times daily. 06/17/19   Neala Miggins, MD    Family History Family History  Problem Relation Age of Onset  . Diabetes Mother   . Diabetes Father     Social History Social History   Tobacco Use  . Smoking status: Never Smoker  . Smokeless tobacco: Current User    Types: Snuff  Substance Use Topics  . Alcohol use: No  . Drug use: Yes    Types: Marijuana    Comment: denies     Allergies   No known allergies   Review of Systems Review of Systems  Constitutional: Negative for fatigue and fever.  HENT: Negative for congestion, rhinorrhea and sore throat.   Respiratory: Negative for cough, shortness of breath and wheezing.   Cardiovascular: Negative for chest pain.  Gastrointestinal: Negative for abdominal pain, diarrhea, nausea and vomiting.  Genitourinary: Negative for difficulty urinating.  Musculoskeletal: Negative for myalgias.  Skin: Negative for color change and rash.  Neurological: Negative for loss of consciousness and headaches.  Psychiatric/Behavioral: Negative for agitation.  All other systems reviewed and are negative.    Physical Exam Updated Vital Signs BP (!) 156/89 (BP Location: Right Arm)   Pulse 89   Temp 98.2 F (36.8 C) (Oral)   Resp 16   Ht 5\' 9"  (1.753 m)   Wt 74.8 kg   SpO2 100%   BMI 24.37 kg/m   Physical Exam Vitals signs and nursing note reviewed.  Constitutional:      Appearance: Normal appearance.  HENT:     Head: Normocephalic and atraumatic.     Jaw: There is normal jaw occlusion. No trismus.      Nose: Nose normal.  Eyes:     Conjunctiva/sclera: Conjunctivae normal.     Pupils: Pupils are equal, round, and reactive to light.  Neck:     Musculoskeletal: Normal range of motion and neck supple.  Cardiovascular:     Rate and Rhythm: Normal rate and regular rhythm.     Pulses: Normal pulses.     Heart sounds: Normal heart sounds.  Pulmonary:     Effort: Pulmonary  effort is normal.     Breath sounds: Normal breath sounds.  Abdominal:     General: Abdomen is flat. Bowel sounds are normal.     Tenderness: There is no abdominal tenderness. There is no guarding.  Musculoskeletal: Normal range of motion.  Lymphadenopathy:     Head:     Left side of head: No preauricular or posterior auricular adenopathy.     Cervical:     Left cervical: No superficial or deep cervical adenopathy.  Skin:    General: Skin is warm and dry.     Capillary Refill: Capillary refill takes less than 2 seconds.  Neurological:     Mental Status: He is alert and oriented to person, place, and time.     Deep Tendon Reflexes: Reflexes normal.  Psychiatric:  Mood and Affect: Mood normal.        Behavior: Behavior normal.      ED Treatments / Results  Labs (all labs ordered are listed, but only abnormal results are displayed) Labs Reviewed - No data to display  EKG None  Radiology No results found.  Procedures Procedures (including critical care time)  Medications Ordered in ED Medications  bacitracin ointment (has no administration in time range)  acetaminophen (TYLENOL) tablet 1,000 mg (has no administration in time range)  Tdap (BOOSTRIX) injection 0.5 mL (0.5 mLs Intramuscular Given 06/17/19 0354)  cephALEXin (KEFLEX) capsule 500 mg (500 mg Oral Given 06/17/19 0355)  doxycycline (VIBRA-TABS) tablet 100 mg (100 mg Oral Given 06/17/19 0355)     Initial Impression / Assessment and Plan / ED Course  Infected ear piercing.  The area is not abscessed at this time though it may abscess.  There is also an infected scab immediately below the ear.  Given the tissue is red and given patient's DM and ESRD antibiotics have been started and dial soaks twice daily with the original orange dial liquid soap as well as twice daily mupirocin.  Patient is to follow up with ENT for recheck and ongoing care.  Strict return instructions given and patient's tetanus was updated.     [name] was evaluated in Emergency Department on 06/17/2019 for the symptoms described in the history of present illness. He was evaluated in the context of the global COVID-19 pandemic, which necessitated consideration that the patient might be at risk for infection with the SARS-CoV-2 virus that causes COVID-19. Institutional protocols and algorithms that pertain to the evaluation of patients at risk for COVID-19 are in a state of rapid change based on information released by regulatory bodies including the CDC and federal and state organizations. These policies and algorithms were followed during the patient's care in the ED.  Final Clinical Impressions(s) / ED Diagnoses   Final diagnoses:  Complication of left ear piercing, initial encounter  Scab  Skin infection    Return for intractable cough, coughing up blood,fevers >100.4 unrelieved by medication, shortness of breath, intractable vomiting, chest pain, shortness of breath, weakness,numbness, changes in speech, facial asymmetry,abdominal pain, passing out,Inability to tolerate liquids or food, cough, altered mental status or any concerns. No signs of systemic illness or infection. The patient is nontoxic-appearing on exam and vital signs are within normal limits.   I have reviewed the triage vital signs and the nursing notes. Pertinent labs &imaging results that were available during my care of the patient were reviewed by me and considered in my medical decision making (see chart for details).  After history, exam, and medical workup I feel the patient has been appropriately medically screened and is safe for discharge home. Pertinent diagnoses were discussed with the patient. Patient was given return precautions  ED Discharge Orders         Ordered    mupirocin cream (BACTROBAN) 2 %  2 times daily     06/17/19 0348    cephALEXin (KEFLEX) 500 MG capsule  4 times daily     06/17/19 0348    doxycycline  (VIBRAMYCIN) 100 MG capsule  2 times daily     06/17/19 0348           Ivor Kishi, MD 06/17/19 [id number]

## 2019-06-17 NOTE — ED Notes (Signed)
Area behind left ear cleaned with soap and water. Bacitracin applied.

## 2019-11-27 DIAGNOSIS — N2581 Secondary hyperparathyroidism of renal origin: Secondary | ICD-10-CM | POA: Diagnosis not present

## 2019-11-27 DIAGNOSIS — N186 End stage renal disease: Secondary | ICD-10-CM | POA: Diagnosis not present

## 2019-11-27 DIAGNOSIS — Z992 Dependence on renal dialysis: Secondary | ICD-10-CM | POA: Diagnosis not present

## 2019-11-29 DIAGNOSIS — N2581 Secondary hyperparathyroidism of renal origin: Secondary | ICD-10-CM | POA: Diagnosis not present

## 2019-11-29 DIAGNOSIS — N186 End stage renal disease: Secondary | ICD-10-CM | POA: Diagnosis not present

## 2019-11-29 DIAGNOSIS — Z992 Dependence on renal dialysis: Secondary | ICD-10-CM | POA: Diagnosis not present

## 2019-12-01 DIAGNOSIS — Z992 Dependence on renal dialysis: Secondary | ICD-10-CM | POA: Diagnosis not present

## 2019-12-01 DIAGNOSIS — N186 End stage renal disease: Secondary | ICD-10-CM | POA: Diagnosis not present

## 2019-12-01 DIAGNOSIS — N2581 Secondary hyperparathyroidism of renal origin: Secondary | ICD-10-CM | POA: Diagnosis not present

## 2019-12-04 DIAGNOSIS — N186 End stage renal disease: Secondary | ICD-10-CM | POA: Diagnosis not present

## 2019-12-04 DIAGNOSIS — Z992 Dependence on renal dialysis: Secondary | ICD-10-CM | POA: Diagnosis not present

## 2019-12-04 DIAGNOSIS — N2581 Secondary hyperparathyroidism of renal origin: Secondary | ICD-10-CM | POA: Diagnosis not present

## 2019-12-06 DIAGNOSIS — Z992 Dependence on renal dialysis: Secondary | ICD-10-CM | POA: Diagnosis not present

## 2019-12-06 DIAGNOSIS — N186 End stage renal disease: Secondary | ICD-10-CM | POA: Diagnosis not present

## 2019-12-06 DIAGNOSIS — N2581 Secondary hyperparathyroidism of renal origin: Secondary | ICD-10-CM | POA: Diagnosis not present

## 2019-12-08 DIAGNOSIS — N186 End stage renal disease: Secondary | ICD-10-CM | POA: Diagnosis not present

## 2019-12-08 DIAGNOSIS — Z992 Dependence on renal dialysis: Secondary | ICD-10-CM | POA: Diagnosis not present

## 2019-12-08 DIAGNOSIS — N2581 Secondary hyperparathyroidism of renal origin: Secondary | ICD-10-CM | POA: Diagnosis not present

## 2019-12-11 DIAGNOSIS — Z992 Dependence on renal dialysis: Secondary | ICD-10-CM | POA: Diagnosis not present

## 2019-12-11 DIAGNOSIS — N186 End stage renal disease: Secondary | ICD-10-CM | POA: Diagnosis not present

## 2019-12-11 DIAGNOSIS — N2581 Secondary hyperparathyroidism of renal origin: Secondary | ICD-10-CM | POA: Diagnosis not present

## 2019-12-13 DIAGNOSIS — N186 End stage renal disease: Secondary | ICD-10-CM | POA: Diagnosis not present

## 2019-12-13 DIAGNOSIS — Z992 Dependence on renal dialysis: Secondary | ICD-10-CM | POA: Diagnosis not present

## 2019-12-13 DIAGNOSIS — N2581 Secondary hyperparathyroidism of renal origin: Secondary | ICD-10-CM | POA: Diagnosis not present

## 2019-12-18 DIAGNOSIS — N186 End stage renal disease: Secondary | ICD-10-CM | POA: Diagnosis not present

## 2019-12-18 DIAGNOSIS — Z992 Dependence on renal dialysis: Secondary | ICD-10-CM | POA: Diagnosis not present

## 2019-12-18 DIAGNOSIS — N2581 Secondary hyperparathyroidism of renal origin: Secondary | ICD-10-CM | POA: Diagnosis not present

## 2019-12-20 DIAGNOSIS — Z992 Dependence on renal dialysis: Secondary | ICD-10-CM | POA: Diagnosis not present

## 2019-12-20 DIAGNOSIS — N186 End stage renal disease: Secondary | ICD-10-CM | POA: Diagnosis not present

## 2019-12-20 DIAGNOSIS — N2581 Secondary hyperparathyroidism of renal origin: Secondary | ICD-10-CM | POA: Diagnosis not present

## 2019-12-22 DIAGNOSIS — Z992 Dependence on renal dialysis: Secondary | ICD-10-CM | POA: Diagnosis not present

## 2019-12-22 DIAGNOSIS — N2581 Secondary hyperparathyroidism of renal origin: Secondary | ICD-10-CM | POA: Diagnosis not present

## 2019-12-22 DIAGNOSIS — N186 End stage renal disease: Secondary | ICD-10-CM | POA: Diagnosis not present

## 2019-12-25 DIAGNOSIS — N186 End stage renal disease: Secondary | ICD-10-CM | POA: Diagnosis not present

## 2019-12-25 DIAGNOSIS — E1122 Type 2 diabetes mellitus with diabetic chronic kidney disease: Secondary | ICD-10-CM | POA: Diagnosis not present

## 2019-12-25 DIAGNOSIS — N2581 Secondary hyperparathyroidism of renal origin: Secondary | ICD-10-CM | POA: Diagnosis not present

## 2019-12-25 DIAGNOSIS — Z992 Dependence on renal dialysis: Secondary | ICD-10-CM | POA: Diagnosis not present

## 2019-12-29 DIAGNOSIS — Z992 Dependence on renal dialysis: Secondary | ICD-10-CM | POA: Diagnosis not present

## 2019-12-29 DIAGNOSIS — N186 End stage renal disease: Secondary | ICD-10-CM | POA: Diagnosis not present

## 2019-12-29 DIAGNOSIS — N2581 Secondary hyperparathyroidism of renal origin: Secondary | ICD-10-CM | POA: Diagnosis not present

## 2020-01-01 DIAGNOSIS — N2581 Secondary hyperparathyroidism of renal origin: Secondary | ICD-10-CM | POA: Diagnosis not present

## 2020-01-01 DIAGNOSIS — Z992 Dependence on renal dialysis: Secondary | ICD-10-CM | POA: Diagnosis not present

## 2020-01-01 DIAGNOSIS — N186 End stage renal disease: Secondary | ICD-10-CM | POA: Diagnosis not present

## 2020-01-05 DIAGNOSIS — N2581 Secondary hyperparathyroidism of renal origin: Secondary | ICD-10-CM | POA: Diagnosis not present

## 2020-01-05 DIAGNOSIS — Z992 Dependence on renal dialysis: Secondary | ICD-10-CM | POA: Diagnosis not present

## 2020-01-05 DIAGNOSIS — N186 End stage renal disease: Secondary | ICD-10-CM | POA: Diagnosis not present

## 2020-01-08 DIAGNOSIS — Z992 Dependence on renal dialysis: Secondary | ICD-10-CM | POA: Diagnosis not present

## 2020-01-08 DIAGNOSIS — N2581 Secondary hyperparathyroidism of renal origin: Secondary | ICD-10-CM | POA: Diagnosis not present

## 2020-01-08 DIAGNOSIS — N186 End stage renal disease: Secondary | ICD-10-CM | POA: Diagnosis not present

## 2020-01-10 DIAGNOSIS — Z992 Dependence on renal dialysis: Secondary | ICD-10-CM | POA: Diagnosis not present

## 2020-01-10 DIAGNOSIS — N2581 Secondary hyperparathyroidism of renal origin: Secondary | ICD-10-CM | POA: Diagnosis not present

## 2020-01-10 DIAGNOSIS — N186 End stage renal disease: Secondary | ICD-10-CM | POA: Diagnosis not present

## 2020-01-12 DIAGNOSIS — Z992 Dependence on renal dialysis: Secondary | ICD-10-CM | POA: Diagnosis not present

## 2020-01-12 DIAGNOSIS — N186 End stage renal disease: Secondary | ICD-10-CM | POA: Diagnosis not present

## 2020-01-12 DIAGNOSIS — N2581 Secondary hyperparathyroidism of renal origin: Secondary | ICD-10-CM | POA: Diagnosis not present

## 2020-01-17 DIAGNOSIS — N2581 Secondary hyperparathyroidism of renal origin: Secondary | ICD-10-CM | POA: Diagnosis not present

## 2020-01-17 DIAGNOSIS — Z992 Dependence on renal dialysis: Secondary | ICD-10-CM | POA: Diagnosis not present

## 2020-01-17 DIAGNOSIS — N186 End stage renal disease: Secondary | ICD-10-CM | POA: Diagnosis not present

## 2020-01-18 DIAGNOSIS — E113511 Type 2 diabetes mellitus with proliferative diabetic retinopathy with macular edema, right eye: Secondary | ICD-10-CM | POA: Diagnosis not present

## 2020-01-22 DIAGNOSIS — N186 End stage renal disease: Secondary | ICD-10-CM | POA: Diagnosis not present

## 2020-01-22 DIAGNOSIS — N2581 Secondary hyperparathyroidism of renal origin: Secondary | ICD-10-CM | POA: Diagnosis not present

## 2020-01-22 DIAGNOSIS — Z992 Dependence on renal dialysis: Secondary | ICD-10-CM | POA: Diagnosis not present

## 2020-01-23 DIAGNOSIS — E113512 Type 2 diabetes mellitus with proliferative diabetic retinopathy with macular edema, left eye: Secondary | ICD-10-CM | POA: Diagnosis not present

## 2020-01-24 DIAGNOSIS — N186 End stage renal disease: Secondary | ICD-10-CM | POA: Diagnosis not present

## 2020-01-24 DIAGNOSIS — Z992 Dependence on renal dialysis: Secondary | ICD-10-CM | POA: Diagnosis not present

## 2020-01-24 DIAGNOSIS — N2581 Secondary hyperparathyroidism of renal origin: Secondary | ICD-10-CM | POA: Diagnosis not present

## 2020-01-24 DIAGNOSIS — E1122 Type 2 diabetes mellitus with diabetic chronic kidney disease: Secondary | ICD-10-CM | POA: Diagnosis not present

## 2020-01-25 DIAGNOSIS — H53132 Sudden visual loss, left eye: Secondary | ICD-10-CM | POA: Diagnosis not present

## 2020-01-25 DIAGNOSIS — H4312 Vitreous hemorrhage, left eye: Secondary | ICD-10-CM | POA: Diagnosis not present

## 2020-01-26 DIAGNOSIS — H4312 Vitreous hemorrhage, left eye: Secondary | ICD-10-CM | POA: Diagnosis not present

## 2020-01-26 DIAGNOSIS — Z992 Dependence on renal dialysis: Secondary | ICD-10-CM | POA: Diagnosis not present

## 2020-01-26 DIAGNOSIS — N186 End stage renal disease: Secondary | ICD-10-CM | POA: Diagnosis not present

## 2020-01-26 DIAGNOSIS — N2581 Secondary hyperparathyroidism of renal origin: Secondary | ICD-10-CM | POA: Diagnosis not present

## 2020-01-26 DIAGNOSIS — H3582 Retinal ischemia: Secondary | ICD-10-CM | POA: Diagnosis not present

## 2020-01-26 DIAGNOSIS — E113512 Type 2 diabetes mellitus with proliferative diabetic retinopathy with macular edema, left eye: Secondary | ICD-10-CM | POA: Diagnosis not present

## 2020-01-31 DIAGNOSIS — N186 End stage renal disease: Secondary | ICD-10-CM | POA: Diagnosis not present

## 2020-01-31 DIAGNOSIS — Z992 Dependence on renal dialysis: Secondary | ICD-10-CM | POA: Diagnosis not present

## 2020-01-31 DIAGNOSIS — N2581 Secondary hyperparathyroidism of renal origin: Secondary | ICD-10-CM | POA: Diagnosis not present

## 2020-02-02 DIAGNOSIS — Z992 Dependence on renal dialysis: Secondary | ICD-10-CM | POA: Diagnosis not present

## 2020-02-02 DIAGNOSIS — N2581 Secondary hyperparathyroidism of renal origin: Secondary | ICD-10-CM | POA: Diagnosis not present

## 2020-02-02 DIAGNOSIS — N186 End stage renal disease: Secondary | ICD-10-CM | POA: Diagnosis not present

## 2020-02-05 DIAGNOSIS — N186 End stage renal disease: Secondary | ICD-10-CM | POA: Diagnosis not present

## 2020-02-05 DIAGNOSIS — Z992 Dependence on renal dialysis: Secondary | ICD-10-CM | POA: Diagnosis not present

## 2020-02-05 DIAGNOSIS — N2581 Secondary hyperparathyroidism of renal origin: Secondary | ICD-10-CM | POA: Diagnosis not present

## 2020-02-07 DIAGNOSIS — N186 End stage renal disease: Secondary | ICD-10-CM | POA: Diagnosis not present

## 2020-02-07 DIAGNOSIS — Z992 Dependence on renal dialysis: Secondary | ICD-10-CM | POA: Diagnosis not present

## 2020-02-07 DIAGNOSIS — N2581 Secondary hyperparathyroidism of renal origin: Secondary | ICD-10-CM | POA: Diagnosis not present

## 2020-02-09 DIAGNOSIS — Z992 Dependence on renal dialysis: Secondary | ICD-10-CM | POA: Diagnosis not present

## 2020-02-09 DIAGNOSIS — N186 End stage renal disease: Secondary | ICD-10-CM | POA: Diagnosis not present

## 2020-02-09 DIAGNOSIS — N2581 Secondary hyperparathyroidism of renal origin: Secondary | ICD-10-CM | POA: Diagnosis not present

## 2020-02-12 DIAGNOSIS — N2581 Secondary hyperparathyroidism of renal origin: Secondary | ICD-10-CM | POA: Diagnosis not present

## 2020-02-12 DIAGNOSIS — Z992 Dependence on renal dialysis: Secondary | ICD-10-CM | POA: Diagnosis not present

## 2020-02-12 DIAGNOSIS — N186 End stage renal disease: Secondary | ICD-10-CM | POA: Diagnosis not present

## 2020-02-14 DIAGNOSIS — Z992 Dependence on renal dialysis: Secondary | ICD-10-CM | POA: Diagnosis not present

## 2020-02-14 DIAGNOSIS — N2581 Secondary hyperparathyroidism of renal origin: Secondary | ICD-10-CM | POA: Diagnosis not present

## 2020-02-14 DIAGNOSIS — N186 End stage renal disease: Secondary | ICD-10-CM | POA: Diagnosis not present

## 2020-02-19 DIAGNOSIS — N186 End stage renal disease: Secondary | ICD-10-CM | POA: Diagnosis not present

## 2020-02-19 DIAGNOSIS — N2581 Secondary hyperparathyroidism of renal origin: Secondary | ICD-10-CM | POA: Diagnosis not present

## 2020-02-19 DIAGNOSIS — Z992 Dependence on renal dialysis: Secondary | ICD-10-CM | POA: Diagnosis not present

## 2020-02-21 DIAGNOSIS — N2581 Secondary hyperparathyroidism of renal origin: Secondary | ICD-10-CM | POA: Diagnosis not present

## 2020-02-21 DIAGNOSIS — Z992 Dependence on renal dialysis: Secondary | ICD-10-CM | POA: Diagnosis not present

## 2020-02-21 DIAGNOSIS — N186 End stage renal disease: Secondary | ICD-10-CM | POA: Diagnosis not present

## 2020-02-22 DIAGNOSIS — E113511 Type 2 diabetes mellitus with proliferative diabetic retinopathy with macular edema, right eye: Secondary | ICD-10-CM | POA: Diagnosis not present

## 2020-02-24 DIAGNOSIS — Z992 Dependence on renal dialysis: Secondary | ICD-10-CM | POA: Diagnosis not present

## 2020-02-24 DIAGNOSIS — E1122 Type 2 diabetes mellitus with diabetic chronic kidney disease: Secondary | ICD-10-CM | POA: Diagnosis not present

## 2020-02-24 DIAGNOSIS — N186 End stage renal disease: Secondary | ICD-10-CM | POA: Diagnosis not present

## 2020-02-26 DIAGNOSIS — N2581 Secondary hyperparathyroidism of renal origin: Secondary | ICD-10-CM | POA: Diagnosis not present

## 2020-02-26 DIAGNOSIS — N186 End stage renal disease: Secondary | ICD-10-CM | POA: Diagnosis not present

## 2020-02-26 DIAGNOSIS — Z992 Dependence on renal dialysis: Secondary | ICD-10-CM | POA: Diagnosis not present

## 2020-02-28 DIAGNOSIS — Z992 Dependence on renal dialysis: Secondary | ICD-10-CM | POA: Diagnosis not present

## 2020-02-28 DIAGNOSIS — N2581 Secondary hyperparathyroidism of renal origin: Secondary | ICD-10-CM | POA: Diagnosis not present

## 2020-02-28 DIAGNOSIS — N186 End stage renal disease: Secondary | ICD-10-CM | POA: Diagnosis not present

## 2020-03-01 DIAGNOSIS — N2581 Secondary hyperparathyroidism of renal origin: Secondary | ICD-10-CM | POA: Diagnosis not present

## 2020-03-01 DIAGNOSIS — Z992 Dependence on renal dialysis: Secondary | ICD-10-CM | POA: Diagnosis not present

## 2020-03-01 DIAGNOSIS — N186 End stage renal disease: Secondary | ICD-10-CM | POA: Diagnosis not present

## 2020-03-04 DIAGNOSIS — Z992 Dependence on renal dialysis: Secondary | ICD-10-CM | POA: Diagnosis not present

## 2020-03-04 DIAGNOSIS — N2581 Secondary hyperparathyroidism of renal origin: Secondary | ICD-10-CM | POA: Diagnosis not present

## 2020-03-04 DIAGNOSIS — N186 End stage renal disease: Secondary | ICD-10-CM | POA: Diagnosis not present

## 2020-03-06 DIAGNOSIS — N186 End stage renal disease: Secondary | ICD-10-CM | POA: Diagnosis not present

## 2020-03-06 DIAGNOSIS — Z992 Dependence on renal dialysis: Secondary | ICD-10-CM | POA: Diagnosis not present

## 2020-03-06 DIAGNOSIS — N2581 Secondary hyperparathyroidism of renal origin: Secondary | ICD-10-CM | POA: Diagnosis not present

## 2020-03-11 DIAGNOSIS — N2581 Secondary hyperparathyroidism of renal origin: Secondary | ICD-10-CM | POA: Diagnosis not present

## 2020-03-11 DIAGNOSIS — Z992 Dependence on renal dialysis: Secondary | ICD-10-CM | POA: Diagnosis not present

## 2020-03-11 DIAGNOSIS — N186 End stage renal disease: Secondary | ICD-10-CM | POA: Diagnosis not present

## 2020-03-12 DIAGNOSIS — E113512 Type 2 diabetes mellitus with proliferative diabetic retinopathy with macular edema, left eye: Secondary | ICD-10-CM | POA: Diagnosis not present

## 2020-03-13 DIAGNOSIS — N186 End stage renal disease: Secondary | ICD-10-CM | POA: Diagnosis not present

## 2020-03-13 DIAGNOSIS — N2581 Secondary hyperparathyroidism of renal origin: Secondary | ICD-10-CM | POA: Diagnosis not present

## 2020-03-13 DIAGNOSIS — Z992 Dependence on renal dialysis: Secondary | ICD-10-CM | POA: Diagnosis not present

## 2020-03-15 DIAGNOSIS — Z992 Dependence on renal dialysis: Secondary | ICD-10-CM | POA: Diagnosis not present

## 2020-03-15 DIAGNOSIS — N2581 Secondary hyperparathyroidism of renal origin: Secondary | ICD-10-CM | POA: Diagnosis not present

## 2020-03-15 DIAGNOSIS — N186 End stage renal disease: Secondary | ICD-10-CM | POA: Diagnosis not present

## 2020-03-18 DIAGNOSIS — N2581 Secondary hyperparathyroidism of renal origin: Secondary | ICD-10-CM | POA: Diagnosis not present

## 2020-03-18 DIAGNOSIS — Z992 Dependence on renal dialysis: Secondary | ICD-10-CM | POA: Diagnosis not present

## 2020-03-18 DIAGNOSIS — N186 End stage renal disease: Secondary | ICD-10-CM | POA: Diagnosis not present

## 2020-03-20 DIAGNOSIS — N186 End stage renal disease: Secondary | ICD-10-CM | POA: Diagnosis not present

## 2020-03-20 DIAGNOSIS — N2581 Secondary hyperparathyroidism of renal origin: Secondary | ICD-10-CM | POA: Diagnosis not present

## 2020-03-20 DIAGNOSIS — Z992 Dependence on renal dialysis: Secondary | ICD-10-CM | POA: Diagnosis not present

## 2020-03-25 DIAGNOSIS — N186 End stage renal disease: Secondary | ICD-10-CM | POA: Diagnosis not present

## 2020-03-25 DIAGNOSIS — Z992 Dependence on renal dialysis: Secondary | ICD-10-CM | POA: Diagnosis not present

## 2020-03-25 DIAGNOSIS — N2581 Secondary hyperparathyroidism of renal origin: Secondary | ICD-10-CM | POA: Diagnosis not present

## 2020-03-26 DIAGNOSIS — Z992 Dependence on renal dialysis: Secondary | ICD-10-CM | POA: Diagnosis not present

## 2020-03-26 DIAGNOSIS — E1122 Type 2 diabetes mellitus with diabetic chronic kidney disease: Secondary | ICD-10-CM | POA: Diagnosis not present

## 2020-03-26 DIAGNOSIS — N186 End stage renal disease: Secondary | ICD-10-CM | POA: Diagnosis not present

## 2020-03-27 DIAGNOSIS — N186 End stage renal disease: Secondary | ICD-10-CM | POA: Diagnosis not present

## 2020-03-27 DIAGNOSIS — Z992 Dependence on renal dialysis: Secondary | ICD-10-CM | POA: Diagnosis not present

## 2020-03-27 DIAGNOSIS — N2581 Secondary hyperparathyroidism of renal origin: Secondary | ICD-10-CM | POA: Diagnosis not present

## 2020-03-29 DIAGNOSIS — N2581 Secondary hyperparathyroidism of renal origin: Secondary | ICD-10-CM | POA: Diagnosis not present

## 2020-03-29 DIAGNOSIS — N186 End stage renal disease: Secondary | ICD-10-CM | POA: Diagnosis not present

## 2020-03-29 DIAGNOSIS — Z992 Dependence on renal dialysis: Secondary | ICD-10-CM | POA: Diagnosis not present

## 2020-04-01 DIAGNOSIS — Z992 Dependence on renal dialysis: Secondary | ICD-10-CM | POA: Diagnosis not present

## 2020-04-01 DIAGNOSIS — N2581 Secondary hyperparathyroidism of renal origin: Secondary | ICD-10-CM | POA: Diagnosis not present

## 2020-04-01 DIAGNOSIS — N186 End stage renal disease: Secondary | ICD-10-CM | POA: Diagnosis not present

## 2020-04-03 DIAGNOSIS — Z992 Dependence on renal dialysis: Secondary | ICD-10-CM | POA: Diagnosis not present

## 2020-04-03 DIAGNOSIS — N2581 Secondary hyperparathyroidism of renal origin: Secondary | ICD-10-CM | POA: Diagnosis not present

## 2020-04-03 DIAGNOSIS — N186 End stage renal disease: Secondary | ICD-10-CM | POA: Diagnosis not present

## 2020-04-05 DIAGNOSIS — N186 End stage renal disease: Secondary | ICD-10-CM | POA: Diagnosis not present

## 2020-04-05 DIAGNOSIS — Z992 Dependence on renal dialysis: Secondary | ICD-10-CM | POA: Diagnosis not present

## 2020-04-05 DIAGNOSIS — N2581 Secondary hyperparathyroidism of renal origin: Secondary | ICD-10-CM | POA: Diagnosis not present

## 2020-04-10 DIAGNOSIS — N2581 Secondary hyperparathyroidism of renal origin: Secondary | ICD-10-CM | POA: Diagnosis not present

## 2020-04-10 DIAGNOSIS — Z992 Dependence on renal dialysis: Secondary | ICD-10-CM | POA: Diagnosis not present

## 2020-04-10 DIAGNOSIS — N186 End stage renal disease: Secondary | ICD-10-CM | POA: Diagnosis not present

## 2020-04-12 DIAGNOSIS — N186 End stage renal disease: Secondary | ICD-10-CM | POA: Diagnosis not present

## 2020-04-12 DIAGNOSIS — Z992 Dependence on renal dialysis: Secondary | ICD-10-CM | POA: Diagnosis not present

## 2020-04-12 DIAGNOSIS — N2581 Secondary hyperparathyroidism of renal origin: Secondary | ICD-10-CM | POA: Diagnosis not present

## 2020-04-15 DIAGNOSIS — N186 End stage renal disease: Secondary | ICD-10-CM | POA: Diagnosis not present

## 2020-04-15 DIAGNOSIS — Z992 Dependence on renal dialysis: Secondary | ICD-10-CM | POA: Diagnosis not present

## 2020-04-15 DIAGNOSIS — N2581 Secondary hyperparathyroidism of renal origin: Secondary | ICD-10-CM | POA: Diagnosis not present

## 2020-04-17 DIAGNOSIS — N2581 Secondary hyperparathyroidism of renal origin: Secondary | ICD-10-CM | POA: Diagnosis not present

## 2020-04-17 DIAGNOSIS — N186 End stage renal disease: Secondary | ICD-10-CM | POA: Diagnosis not present

## 2020-04-17 DIAGNOSIS — Z992 Dependence on renal dialysis: Secondary | ICD-10-CM | POA: Diagnosis not present

## 2020-04-19 DIAGNOSIS — Z992 Dependence on renal dialysis: Secondary | ICD-10-CM | POA: Diagnosis not present

## 2020-04-19 DIAGNOSIS — N2581 Secondary hyperparathyroidism of renal origin: Secondary | ICD-10-CM | POA: Diagnosis not present

## 2020-04-19 DIAGNOSIS — N186 End stage renal disease: Secondary | ICD-10-CM | POA: Diagnosis not present

## 2020-04-24 DIAGNOSIS — N186 End stage renal disease: Secondary | ICD-10-CM | POA: Diagnosis not present

## 2020-04-24 DIAGNOSIS — N2581 Secondary hyperparathyroidism of renal origin: Secondary | ICD-10-CM | POA: Diagnosis not present

## 2020-04-24 DIAGNOSIS — Z992 Dependence on renal dialysis: Secondary | ICD-10-CM | POA: Diagnosis not present

## 2020-04-25 DIAGNOSIS — Z992 Dependence on renal dialysis: Secondary | ICD-10-CM | POA: Diagnosis not present

## 2020-04-25 DIAGNOSIS — E1122 Type 2 diabetes mellitus with diabetic chronic kidney disease: Secondary | ICD-10-CM | POA: Diagnosis not present

## 2020-04-25 DIAGNOSIS — N186 End stage renal disease: Secondary | ICD-10-CM | POA: Diagnosis not present

## 2020-04-26 DIAGNOSIS — N186 End stage renal disease: Secondary | ICD-10-CM | POA: Diagnosis not present

## 2020-04-26 DIAGNOSIS — Z992 Dependence on renal dialysis: Secondary | ICD-10-CM | POA: Diagnosis not present

## 2020-04-26 DIAGNOSIS — N2581 Secondary hyperparathyroidism of renal origin: Secondary | ICD-10-CM | POA: Diagnosis not present

## 2020-04-29 DIAGNOSIS — Z992 Dependence on renal dialysis: Secondary | ICD-10-CM | POA: Diagnosis not present

## 2020-04-29 DIAGNOSIS — N2581 Secondary hyperparathyroidism of renal origin: Secondary | ICD-10-CM | POA: Diagnosis not present

## 2020-04-29 DIAGNOSIS — N186 End stage renal disease: Secondary | ICD-10-CM | POA: Diagnosis not present

## 2020-04-30 DIAGNOSIS — E113511 Type 2 diabetes mellitus with proliferative diabetic retinopathy with macular edema, right eye: Secondary | ICD-10-CM | POA: Diagnosis not present

## 2020-05-01 DIAGNOSIS — Z992 Dependence on renal dialysis: Secondary | ICD-10-CM | POA: Diagnosis not present

## 2020-05-01 DIAGNOSIS — N2581 Secondary hyperparathyroidism of renal origin: Secondary | ICD-10-CM | POA: Diagnosis not present

## 2020-05-01 DIAGNOSIS — N186 End stage renal disease: Secondary | ICD-10-CM | POA: Diagnosis not present

## 2020-05-03 DIAGNOSIS — Z992 Dependence on renal dialysis: Secondary | ICD-10-CM | POA: Diagnosis not present

## 2020-05-03 DIAGNOSIS — N186 End stage renal disease: Secondary | ICD-10-CM | POA: Diagnosis not present

## 2020-05-03 DIAGNOSIS — N2581 Secondary hyperparathyroidism of renal origin: Secondary | ICD-10-CM | POA: Diagnosis not present

## 2020-05-06 DIAGNOSIS — N2581 Secondary hyperparathyroidism of renal origin: Secondary | ICD-10-CM | POA: Diagnosis not present

## 2020-05-06 DIAGNOSIS — N186 End stage renal disease: Secondary | ICD-10-CM | POA: Diagnosis not present

## 2020-05-06 DIAGNOSIS — Z992 Dependence on renal dialysis: Secondary | ICD-10-CM | POA: Diagnosis not present

## 2020-05-07 DIAGNOSIS — E113512 Type 2 diabetes mellitus with proliferative diabetic retinopathy with macular edema, left eye: Secondary | ICD-10-CM | POA: Diagnosis not present

## 2020-05-08 DIAGNOSIS — Z992 Dependence on renal dialysis: Secondary | ICD-10-CM | POA: Diagnosis not present

## 2020-05-08 DIAGNOSIS — N186 End stage renal disease: Secondary | ICD-10-CM | POA: Diagnosis not present

## 2020-05-08 DIAGNOSIS — N2581 Secondary hyperparathyroidism of renal origin: Secondary | ICD-10-CM | POA: Diagnosis not present

## 2020-05-10 ENCOUNTER — Emergency Department (HOSPITAL_BASED_OUTPATIENT_CLINIC_OR_DEPARTMENT_OTHER): Payer: Medicare HMO

## 2020-05-10 ENCOUNTER — Emergency Department (HOSPITAL_BASED_OUTPATIENT_CLINIC_OR_DEPARTMENT_OTHER)
Admission: EM | Admit: 2020-05-10 | Discharge: 2020-05-10 | Disposition: A | Discharge status: Home / Self Care | Payer: Medicare HMO | Attending: Emergency Medicine | Admitting: Emergency Medicine

## 2020-05-10 ENCOUNTER — Encounter (HOSPITAL_BASED_OUTPATIENT_CLINIC_OR_DEPARTMENT_OTHER): Payer: Self-pay

## 2020-05-10 ENCOUNTER — Other Ambulatory Visit (HOSPITAL_BASED_OUTPATIENT_CLINIC_OR_DEPARTMENT_OTHER): Payer: Self-pay | Admitting: Emergency Medicine

## 2020-05-10 ENCOUNTER — Other Ambulatory Visit: Payer: Self-pay

## 2020-05-10 DIAGNOSIS — S199XXA Unspecified injury of neck, initial encounter: Secondary | ICD-10-CM | POA: Diagnosis present

## 2020-05-10 DIAGNOSIS — E119 Type 2 diabetes mellitus without complications: Secondary | ICD-10-CM | POA: Diagnosis not present

## 2020-05-10 DIAGNOSIS — Z79899 Other long term (current) drug therapy: Secondary | ICD-10-CM | POA: Insufficient documentation

## 2020-05-10 DIAGNOSIS — Y92410 Unspecified street and highway as the place of occurrence of the external cause: Secondary | ICD-10-CM | POA: Insufficient documentation

## 2020-05-10 DIAGNOSIS — S39012A Strain of muscle, fascia and tendon of lower back, initial encounter: Secondary | ICD-10-CM | POA: Diagnosis not present

## 2020-05-10 DIAGNOSIS — S161XXA Strain of muscle, fascia and tendon at neck level, initial encounter: Secondary | ICD-10-CM | POA: Diagnosis not present

## 2020-05-10 DIAGNOSIS — N186 End stage renal disease: Secondary | ICD-10-CM | POA: Diagnosis not present

## 2020-05-10 DIAGNOSIS — Y9389 Activity, other specified: Secondary | ICD-10-CM | POA: Diagnosis not present

## 2020-05-10 DIAGNOSIS — R519 Headache, unspecified: Secondary | ICD-10-CM | POA: Insufficient documentation

## 2020-05-10 DIAGNOSIS — M545 Low back pain, unspecified: Secondary | ICD-10-CM | POA: Diagnosis not present

## 2020-05-10 DIAGNOSIS — I10 Essential (primary) hypertension: Secondary | ICD-10-CM

## 2020-05-10 DIAGNOSIS — Z041 Encounter for examination and observation following transport accident: Secondary | ICD-10-CM | POA: Diagnosis not present

## 2020-05-10 DIAGNOSIS — I120 Hypertensive chronic kidney disease with stage 5 chronic kidney disease or end stage renal disease: Secondary | ICD-10-CM | POA: Insufficient documentation

## 2020-05-10 MED ORDER — HYDRALAZINE HCL 25 MG PO TABS
50.0000 mg | ORAL_TABLET | Freq: Once | ORAL | Status: AC
  Administered 2020-05-10: 50 mg via ORAL
  Filled 2020-05-10: qty 2

## 2020-05-10 MED ORDER — IBUPROFEN 600 MG PO TABS: 600.0000 mg | ORAL_TABLET | Freq: Three times a day (TID) | ORAL | 0 refills | Status: DC | PRN

## 2020-05-10 MED ORDER — CYCLOBENZAPRINE HCL 10 MG PO TABS: 10.0000 mg | ORAL_TABLET | Freq: Two times a day (BID) | ORAL | 0 refills | Status: DC | PRN

## 2020-05-10 MED FILL — CYCLOBENZAPRINE HCL 10 MG T: 10 | 10 days supply | Qty: 20 | Fill #0

## 2020-05-10 MED FILL — IBUPROFEN 600 MG TABLET: 600 | 10 days supply | Qty: 30 | Fill #0

## 2020-05-10 NOTE — Discharge Instructions (Addendum)
Take the medications as needed for pain and muscle spasms.  Make sure to take your blood pressure medications.  You can also try massage and warm compresses for the muscle pain.

## 2020-05-10 NOTE — ED Notes (Signed)
Patient transported to CT 

## 2020-05-10 NOTE — ED Notes (Signed)
ED Provider at bedside. 
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
## 2020-05-13 DIAGNOSIS — N186 End stage renal disease: Secondary | ICD-10-CM | POA: Diagnosis not present

## 2020-05-13 DIAGNOSIS — N2581 Secondary hyperparathyroidism of renal origin: Secondary | ICD-10-CM | POA: Diagnosis not present

## 2020-05-13 DIAGNOSIS — Z992 Dependence on renal dialysis: Secondary | ICD-10-CM | POA: Diagnosis not present

## 2020-05-15 DIAGNOSIS — Z992 Dependence on renal dialysis: Secondary | ICD-10-CM | POA: Diagnosis not present

## 2020-05-15 DIAGNOSIS — N2581 Secondary hyperparathyroidism of renal origin: Secondary | ICD-10-CM | POA: Diagnosis not present

## 2020-05-15 DIAGNOSIS — N186 End stage renal disease: Secondary | ICD-10-CM | POA: Diagnosis not present

## 2020-05-17 DIAGNOSIS — N186 End stage renal disease: Secondary | ICD-10-CM | POA: Diagnosis not present

## 2020-05-17 DIAGNOSIS — N2581 Secondary hyperparathyroidism of renal origin: Secondary | ICD-10-CM | POA: Diagnosis not present

## 2020-05-17 DIAGNOSIS — Z992 Dependence on renal dialysis: Secondary | ICD-10-CM | POA: Diagnosis not present

## 2020-05-20 DIAGNOSIS — N2581 Secondary hyperparathyroidism of renal origin: Secondary | ICD-10-CM | POA: Diagnosis not present

## 2020-05-20 DIAGNOSIS — N186 End stage renal disease: Secondary | ICD-10-CM | POA: Diagnosis not present

## 2020-05-20 DIAGNOSIS — Z992 Dependence on renal dialysis: Secondary | ICD-10-CM | POA: Diagnosis not present

## 2020-05-22 DIAGNOSIS — Z992 Dependence on renal dialysis: Secondary | ICD-10-CM | POA: Diagnosis not present

## 2020-05-22 DIAGNOSIS — N186 End stage renal disease: Secondary | ICD-10-CM | POA: Diagnosis not present

## 2020-05-22 DIAGNOSIS — N2581 Secondary hyperparathyroidism of renal origin: Secondary | ICD-10-CM | POA: Diagnosis not present

## 2020-05-24 DIAGNOSIS — N186 End stage renal disease: Secondary | ICD-10-CM | POA: Diagnosis not present

## 2020-05-24 DIAGNOSIS — Z992 Dependence on renal dialysis: Secondary | ICD-10-CM | POA: Diagnosis not present

## 2020-05-24 DIAGNOSIS — N2581 Secondary hyperparathyroidism of renal origin: Secondary | ICD-10-CM | POA: Diagnosis not present

## 2020-05-26 DIAGNOSIS — E1122 Type 2 diabetes mellitus with diabetic chronic kidney disease: Secondary | ICD-10-CM | POA: Diagnosis not present

## 2020-05-26 DIAGNOSIS — Z992 Dependence on renal dialysis: Secondary | ICD-10-CM | POA: Diagnosis not present

## 2020-05-26 DIAGNOSIS — N186 End stage renal disease: Secondary | ICD-10-CM | POA: Diagnosis not present

## 2020-05-27 DIAGNOSIS — N186 End stage renal disease: Secondary | ICD-10-CM | POA: Diagnosis not present

## 2020-05-27 DIAGNOSIS — Z992 Dependence on renal dialysis: Secondary | ICD-10-CM | POA: Diagnosis not present

## 2020-05-27 DIAGNOSIS — N2581 Secondary hyperparathyroidism of renal origin: Secondary | ICD-10-CM | POA: Diagnosis not present

## 2020-05-29 DIAGNOSIS — N186 End stage renal disease: Secondary | ICD-10-CM | POA: Diagnosis not present

## 2020-05-29 DIAGNOSIS — Z992 Dependence on renal dialysis: Secondary | ICD-10-CM | POA: Diagnosis not present

## 2020-05-29 DIAGNOSIS — N2581 Secondary hyperparathyroidism of renal origin: Secondary | ICD-10-CM | POA: Diagnosis not present

## 2020-05-31 DIAGNOSIS — Z992 Dependence on renal dialysis: Secondary | ICD-10-CM | POA: Diagnosis not present

## 2020-05-31 DIAGNOSIS — N186 End stage renal disease: Secondary | ICD-10-CM | POA: Diagnosis not present

## 2020-05-31 DIAGNOSIS — N2581 Secondary hyperparathyroidism of renal origin: Secondary | ICD-10-CM | POA: Diagnosis not present

## 2020-06-03 DIAGNOSIS — N186 End stage renal disease: Secondary | ICD-10-CM | POA: Diagnosis not present

## 2020-06-03 DIAGNOSIS — Z992 Dependence on renal dialysis: Secondary | ICD-10-CM | POA: Diagnosis not present

## 2020-06-03 DIAGNOSIS — N2581 Secondary hyperparathyroidism of renal origin: Secondary | ICD-10-CM | POA: Diagnosis not present

## 2020-06-05 DIAGNOSIS — N186 End stage renal disease: Secondary | ICD-10-CM | POA: Diagnosis not present

## 2020-06-05 DIAGNOSIS — N2581 Secondary hyperparathyroidism of renal origin: Secondary | ICD-10-CM | POA: Diagnosis not present

## 2020-06-05 DIAGNOSIS — Z992 Dependence on renal dialysis: Secondary | ICD-10-CM | POA: Diagnosis not present

## 2020-06-07 DIAGNOSIS — N2581 Secondary hyperparathyroidism of renal origin: Secondary | ICD-10-CM | POA: Diagnosis not present

## 2020-06-07 DIAGNOSIS — N186 End stage renal disease: Secondary | ICD-10-CM | POA: Diagnosis not present

## 2020-06-07 DIAGNOSIS — Z992 Dependence on renal dialysis: Secondary | ICD-10-CM | POA: Diagnosis not present

## 2020-06-10 DIAGNOSIS — N186 End stage renal disease: Secondary | ICD-10-CM | POA: Diagnosis not present

## 2020-06-10 DIAGNOSIS — Z992 Dependence on renal dialysis: Secondary | ICD-10-CM | POA: Diagnosis not present

## 2020-06-10 DIAGNOSIS — N2581 Secondary hyperparathyroidism of renal origin: Secondary | ICD-10-CM | POA: Diagnosis not present

## 2020-06-14 DIAGNOSIS — Z992 Dependence on renal dialysis: Secondary | ICD-10-CM | POA: Diagnosis not present

## 2020-06-14 DIAGNOSIS — N2581 Secondary hyperparathyroidism of renal origin: Secondary | ICD-10-CM | POA: Diagnosis not present

## 2020-06-14 DIAGNOSIS — N186 End stage renal disease: Secondary | ICD-10-CM | POA: Diagnosis not present

## 2020-06-16 DIAGNOSIS — Z992 Dependence on renal dialysis: Secondary | ICD-10-CM | POA: Diagnosis not present

## 2020-06-16 DIAGNOSIS — N2581 Secondary hyperparathyroidism of renal origin: Secondary | ICD-10-CM | POA: Diagnosis not present

## 2020-06-16 DIAGNOSIS — N186 End stage renal disease: Secondary | ICD-10-CM | POA: Diagnosis not present

## 2020-06-18 DIAGNOSIS — N186 End stage renal disease: Secondary | ICD-10-CM | POA: Diagnosis not present

## 2020-06-18 DIAGNOSIS — N2581 Secondary hyperparathyroidism of renal origin: Secondary | ICD-10-CM | POA: Diagnosis not present

## 2020-06-18 DIAGNOSIS — Z992 Dependence on renal dialysis: Secondary | ICD-10-CM | POA: Diagnosis not present

## 2020-06-24 DIAGNOSIS — N2581 Secondary hyperparathyroidism of renal origin: Secondary | ICD-10-CM | POA: Diagnosis not present

## 2020-06-24 DIAGNOSIS — Z992 Dependence on renal dialysis: Secondary | ICD-10-CM | POA: Diagnosis not present

## 2020-06-24 DIAGNOSIS — N186 End stage renal disease: Secondary | ICD-10-CM | POA: Diagnosis not present

## 2020-06-25 DIAGNOSIS — Z992 Dependence on renal dialysis: Secondary | ICD-10-CM | POA: Diagnosis not present

## 2020-06-25 DIAGNOSIS — N186 End stage renal disease: Secondary | ICD-10-CM | POA: Diagnosis not present

## 2020-06-25 DIAGNOSIS — E1122 Type 2 diabetes mellitus with diabetic chronic kidney disease: Secondary | ICD-10-CM | POA: Diagnosis not present

## 2020-06-26 DIAGNOSIS — N186 End stage renal disease: Secondary | ICD-10-CM | POA: Diagnosis not present

## 2020-06-26 DIAGNOSIS — N2581 Secondary hyperparathyroidism of renal origin: Secondary | ICD-10-CM | POA: Diagnosis not present

## 2020-06-26 DIAGNOSIS — Z992 Dependence on renal dialysis: Secondary | ICD-10-CM | POA: Diagnosis not present

## 2020-06-28 DIAGNOSIS — Z992 Dependence on renal dialysis: Secondary | ICD-10-CM | POA: Diagnosis not present

## 2020-06-28 DIAGNOSIS — N2581 Secondary hyperparathyroidism of renal origin: Secondary | ICD-10-CM | POA: Diagnosis not present

## 2020-06-28 DIAGNOSIS — N186 End stage renal disease: Secondary | ICD-10-CM | POA: Diagnosis not present

## 2020-07-01 DIAGNOSIS — Z992 Dependence on renal dialysis: Secondary | ICD-10-CM | POA: Diagnosis not present

## 2020-07-01 DIAGNOSIS — N2581 Secondary hyperparathyroidism of renal origin: Secondary | ICD-10-CM | POA: Diagnosis not present

## 2020-07-01 DIAGNOSIS — N186 End stage renal disease: Secondary | ICD-10-CM | POA: Diagnosis not present

## 2020-07-05 DIAGNOSIS — Z992 Dependence on renal dialysis: Secondary | ICD-10-CM | POA: Diagnosis not present

## 2020-07-05 DIAGNOSIS — N186 End stage renal disease: Secondary | ICD-10-CM | POA: Diagnosis not present

## 2020-07-05 DIAGNOSIS — N2581 Secondary hyperparathyroidism of renal origin: Secondary | ICD-10-CM | POA: Diagnosis not present

## 2020-07-10 DIAGNOSIS — N2581 Secondary hyperparathyroidism of renal origin: Secondary | ICD-10-CM | POA: Diagnosis not present

## 2020-07-10 DIAGNOSIS — Z992 Dependence on renal dialysis: Secondary | ICD-10-CM | POA: Diagnosis not present

## 2020-07-10 DIAGNOSIS — N186 End stage renal disease: Secondary | ICD-10-CM | POA: Diagnosis not present

## 2020-07-12 DIAGNOSIS — N186 End stage renal disease: Secondary | ICD-10-CM | POA: Diagnosis not present

## 2020-07-12 DIAGNOSIS — Z992 Dependence on renal dialysis: Secondary | ICD-10-CM | POA: Diagnosis not present

## 2020-07-12 DIAGNOSIS — N2581 Secondary hyperparathyroidism of renal origin: Secondary | ICD-10-CM | POA: Diagnosis not present

## 2020-07-15 DIAGNOSIS — Z992 Dependence on renal dialysis: Secondary | ICD-10-CM | POA: Diagnosis not present

## 2020-07-15 DIAGNOSIS — N186 End stage renal disease: Secondary | ICD-10-CM | POA: Diagnosis not present

## 2020-07-15 DIAGNOSIS — N2581 Secondary hyperparathyroidism of renal origin: Secondary | ICD-10-CM | POA: Diagnosis not present

## 2020-07-17 DIAGNOSIS — Z992 Dependence on renal dialysis: Secondary | ICD-10-CM | POA: Diagnosis not present

## 2020-07-17 DIAGNOSIS — N186 End stage renal disease: Secondary | ICD-10-CM | POA: Diagnosis not present

## 2020-07-17 DIAGNOSIS — N2581 Secondary hyperparathyroidism of renal origin: Secondary | ICD-10-CM | POA: Diagnosis not present

## 2020-07-22 DIAGNOSIS — N186 End stage renal disease: Secondary | ICD-10-CM | POA: Diagnosis not present

## 2020-07-22 DIAGNOSIS — N2581 Secondary hyperparathyroidism of renal origin: Secondary | ICD-10-CM | POA: Diagnosis not present

## 2020-07-22 DIAGNOSIS — Z992 Dependence on renal dialysis: Secondary | ICD-10-CM | POA: Diagnosis not present

## 2020-07-24 DIAGNOSIS — N186 End stage renal disease: Secondary | ICD-10-CM | POA: Diagnosis not present

## 2020-07-24 DIAGNOSIS — N2581 Secondary hyperparathyroidism of renal origin: Secondary | ICD-10-CM | POA: Diagnosis not present

## 2020-07-24 DIAGNOSIS — Z992 Dependence on renal dialysis: Secondary | ICD-10-CM | POA: Diagnosis not present

## 2020-07-26 DIAGNOSIS — N186 End stage renal disease: Secondary | ICD-10-CM | POA: Diagnosis not present

## 2020-07-26 DIAGNOSIS — Z992 Dependence on renal dialysis: Secondary | ICD-10-CM | POA: Diagnosis not present

## 2020-07-26 DIAGNOSIS — E1122 Type 2 diabetes mellitus with diabetic chronic kidney disease: Secondary | ICD-10-CM | POA: Diagnosis not present

## 2020-07-29 DIAGNOSIS — N186 End stage renal disease: Secondary | ICD-10-CM | POA: Diagnosis not present

## 2020-07-29 DIAGNOSIS — N2581 Secondary hyperparathyroidism of renal origin: Secondary | ICD-10-CM | POA: Diagnosis not present

## 2020-07-29 DIAGNOSIS — Z992 Dependence on renal dialysis: Secondary | ICD-10-CM | POA: Diagnosis not present

## 2020-07-31 DIAGNOSIS — N2581 Secondary hyperparathyroidism of renal origin: Secondary | ICD-10-CM | POA: Diagnosis not present

## 2020-07-31 DIAGNOSIS — Z992 Dependence on renal dialysis: Secondary | ICD-10-CM | POA: Diagnosis not present

## 2020-07-31 DIAGNOSIS — N186 End stage renal disease: Secondary | ICD-10-CM | POA: Diagnosis not present

## 2020-08-01 DIAGNOSIS — E113511 Type 2 diabetes mellitus with proliferative diabetic retinopathy with macular edema, right eye: Secondary | ICD-10-CM | POA: Diagnosis not present

## 2020-08-04 DIAGNOSIS — Z7982 Long term (current) use of aspirin: Secondary | ICD-10-CM | POA: Diagnosis not present

## 2020-08-04 DIAGNOSIS — N186 End stage renal disease: Secondary | ICD-10-CM | POA: Diagnosis not present

## 2020-08-04 DIAGNOSIS — I120 Hypertensive chronic kidney disease with stage 5 chronic kidney disease or end stage renal disease: Secondary | ICD-10-CM | POA: Diagnosis not present

## 2020-08-04 DIAGNOSIS — E1122 Type 2 diabetes mellitus with diabetic chronic kidney disease: Secondary | ICD-10-CM | POA: Diagnosis not present

## 2020-08-04 DIAGNOSIS — E875 Hyperkalemia: Secondary | ICD-10-CM | POA: Diagnosis not present

## 2020-08-04 DIAGNOSIS — D696 Thrombocytopenia, unspecified: Secondary | ICD-10-CM | POA: Diagnosis not present

## 2020-08-04 DIAGNOSIS — Z992 Dependence on renal dialysis: Secondary | ICD-10-CM | POA: Diagnosis not present

## 2020-08-04 DIAGNOSIS — Z538 Procedure and treatment not carried out for other reasons: Secondary | ICD-10-CM | POA: Diagnosis not present

## 2020-08-04 DIAGNOSIS — U071 COVID-19: Secondary | ICD-10-CM | POA: Diagnosis not present

## 2020-08-04 DIAGNOSIS — Z01818 Encounter for other preprocedural examination: Secondary | ICD-10-CM | POA: Diagnosis not present

## 2020-08-05 DIAGNOSIS — Z01810 Encounter for preprocedural cardiovascular examination: Secondary | ICD-10-CM | POA: Diagnosis not present

## 2020-08-05 DIAGNOSIS — Z992 Dependence on renal dialysis: Secondary | ICD-10-CM | POA: Diagnosis not present

## 2020-08-05 DIAGNOSIS — N186 End stage renal disease: Secondary | ICD-10-CM | POA: Diagnosis not present

## 2020-08-05 DIAGNOSIS — N2581 Secondary hyperparathyroidism of renal origin: Secondary | ICD-10-CM | POA: Diagnosis not present

## 2020-08-07 DIAGNOSIS — N2581 Secondary hyperparathyroidism of renal origin: Secondary | ICD-10-CM | POA: Diagnosis not present

## 2020-08-07 DIAGNOSIS — Z992 Dependence on renal dialysis: Secondary | ICD-10-CM | POA: Diagnosis not present

## 2020-08-07 DIAGNOSIS — N186 End stage renal disease: Secondary | ICD-10-CM | POA: Diagnosis not present

## 2020-08-08 ENCOUNTER — Other Ambulatory Visit: Payer: Self-pay

## 2020-08-08 ENCOUNTER — Encounter (HOSPITAL_BASED_OUTPATIENT_CLINIC_OR_DEPARTMENT_OTHER): Payer: Self-pay

## 2020-08-08 DIAGNOSIS — X58XXXA Exposure to other specified factors, initial encounter: Secondary | ICD-10-CM | POA: Diagnosis not present

## 2020-08-08 DIAGNOSIS — Z79899 Other long term (current) drug therapy: Secondary | ICD-10-CM | POA: Insufficient documentation

## 2020-08-08 DIAGNOSIS — E1159 Type 2 diabetes mellitus with other circulatory complications: Secondary | ICD-10-CM | POA: Diagnosis not present

## 2020-08-08 DIAGNOSIS — S90421A Blister (nonthermal), right great toe, initial encounter: Secondary | ICD-10-CM | POA: Insufficient documentation

## 2020-08-08 DIAGNOSIS — S8991XA Unspecified injury of right lower leg, initial encounter: Secondary | ICD-10-CM | POA: Diagnosis present

## 2020-08-08 DIAGNOSIS — I120 Hypertensive chronic kidney disease with stage 5 chronic kidney disease or end stage renal disease: Secondary | ICD-10-CM | POA: Insufficient documentation

## 2020-08-08 DIAGNOSIS — Z992 Dependence on renal dialysis: Secondary | ICD-10-CM | POA: Diagnosis not present

## 2020-08-08 DIAGNOSIS — E1122 Type 2 diabetes mellitus with diabetic chronic kidney disease: Secondary | ICD-10-CM | POA: Diagnosis not present

## 2020-08-08 DIAGNOSIS — N186 End stage renal disease: Secondary | ICD-10-CM | POA: Diagnosis not present

## 2020-08-08 DIAGNOSIS — S90426A Blister (nonthermal), unspecified lesser toe(s), initial encounter: Secondary | ICD-10-CM | POA: Insufficient documentation

## 2020-08-08 DIAGNOSIS — S90424A Blister (nonthermal), right lesser toe(s), initial encounter: Secondary | ICD-10-CM | POA: Diagnosis not present

## 2020-08-08 NOTE — ED Triage Notes (Signed)
Pt c/o "blister" to right great toe and right toe-pt states he had a pedicure yesterday-NAD-steady gait

## 2020-08-09 ENCOUNTER — Emergency Department (HOSPITAL_BASED_OUTPATIENT_CLINIC_OR_DEPARTMENT_OTHER)
Admission: EM | Admit: 2020-08-09 | Discharge: 2020-08-09 | Disposition: A | Discharge status: Home / Self Care | Payer: Medicare HMO | Attending: Emergency Medicine | Admitting: Emergency Medicine

## 2020-08-09 DIAGNOSIS — S90421A Blister (nonthermal), right great toe, initial encounter: Secondary | ICD-10-CM | POA: Diagnosis not present

## 2020-08-09 DIAGNOSIS — S90426A Blister (nonthermal), unspecified lesser toe(s), initial encounter: Secondary | ICD-10-CM

## 2020-08-09 DIAGNOSIS — Z1159 Encounter for screening for other viral diseases: Secondary | ICD-10-CM | POA: Diagnosis not present

## 2020-08-09 MED ORDER — MUPIROCIN 2 % EX OINT
TOPICAL_OINTMENT | CUTANEOUS | Status: AC
  Filled 2020-08-09: qty 22

## 2020-08-09 MED ORDER — MUPIROCIN CALCIUM 2 % EX CREA
TOPICAL_CREAM | Freq: Three times a day (TID) | CUTANEOUS | Status: DC
  Filled 2020-08-09: qty 15

## 2020-08-09 NOTE — ED Provider Notes (Signed)
[facility] DEPT [facility] Provider Note: Georgena Spurling, MD, FACEP  CSN: [id number] MRN: [id number] ARRIVAL: 08/08/20 at 2216 ROOM: Lead  Toe Problem   HISTORY OF PRESENT ILLNESS  08/09/20 1:41 AM Casey Preston is a 50 y.o. male with diabetes and end-stage renal disease on dialysis.  He has decreased sensation in his toes.  He is here with a blister on the dorsum of his right second toe that began last week and has subsequently broken down.  That site is now healing.  He has subsequently developed a blister on the medial aspect of the right great toe.  There is no associated pain, erythema or warmth.  He is not aware of his shoes rubbing those toes.  He has not had any specific trauma.   Past Medical History:  Diagnosis Date  . Anemia   . 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 Diabetes mellitus without complication ([place])    not on medication- Per kidney biospy  . ESRD (end stage renal disease) ([place])    MWF  . Hypertension   . Pneumonia 06/2016    Past Surgical History:  Procedure Laterality Date  . AIR/FLUID EXCHANGE Left 01/26/2017   Procedure: AIR/FLUID EXCHANGE left Eye;  Surgeon: Jalene Mullet, MD;  Location: [facility];  Service: Ophthalmology;  Laterality: Left;  . BASCILIC VEIN TRANSPOSITION Left 12/03/2016   Procedure: BASCILIC VEIN TRANSPOSITION-LEFT 1ST STAGE;  Surgeon: Serafina Mitchell, MD;  Location: [facility];  Service: Vascular;  Laterality: Left;  . BASCILIC VEIN TRANSPOSITION Left 01/28/2017   Procedure: BASCILIC VEIN TRANSPOSITION-LEFT 2ND STAGE;  Surgeon: Waynetta Sandy, MD;  Location: [facility];  Service: Vascular;  Laterality: Left;  . COLONOSCOPY    . INSERTION OF DIALYSIS CATHETER Right 12/03/2016   Procedure: INSERTION OF DIALYSIS CATHETER;  Surgeon: Serafina Mitchell, MD;  Location: [facility];  Service: Vascular;  Laterality: Right;  . LASER PHOTO ABLATION Left 01/26/2017   Procedure: LASER PHOTO ABLATION left Eye;   Surgeon: Jalene Mullet, MD;  Location: [facility];  Service: Ophthalmology;  Laterality: Left;  . MEMBRANE PEEL Left 01/26/2017   Procedure: MEMBRANE PEEL left eye;  Surgeon: Jalene Mullet, MD;  Location: [facility];  Service: Ophthalmology;  Laterality: Left;  . RENAL BIOPSY Left   . 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 REPAIR OF COMPLEX TRACTION RETINAL DETACHMENT Left 01/26/2017   Procedure: REPAIR OF COMPLEX TRACTION RETINAL DETACHMENT LEFT EYE WITH VITRECTOMY;  Surgeon: Jalene Mullet, MD;  Location: [facility];  Service: Ophthalmology;  Laterality: Left;    Family History  Problem Relation Age of Onset  . Diabetes Mother   . Diabetes Father     Social History   Tobacco Use  . Smoking status: Never Smoker  . Smokeless tobacco: Never Used  Vaping Use  . Vaping Use: Never used  Substance Use Topics  . Alcohol use: No  . Drug use: Yes    Types: Marijuana    Prior to Admission medications   Medication Sig Start Date End Date Taking? Authorizing Provider  amLODipine (NORVASC) 10 MG tablet Take 10 mg by mouth daily at 2 PM.    [provider]  hydrALAZINE (APRESOLINE) 50 MG tablet Take 50 mg by mouth 2 (two) times daily. 03/13/20   [provider]    Allergies No known allergies   REVIEW OF SYSTEMS  Negative except as noted here or in the History of Present Illness.   PHYSICAL EXAMINATION  Initial Vital Signs Blood pressure (!) 145/100, pulse 85, temperature 99.2 F (37.3 C), temperature source Oral, resp. rate 20, height 5\' 10"  (1.778 m), weight 74.4 kg, SpO2 95 %.  Examination General: Well-developed, well-nourished male in no acute distress; appearance consistent with age of record HENT: normocephalic; atraumatic Eyes: Normal appearance Neck: supple Heart: regular rate and  rhythm Lungs: clear to auscultation bilaterally Abdomen: soft; nondistended; nontender; bowel sounds present Extremities: No deformity; full range of motion; pulses normal; healing blister of dorsal right second toe, blister of right great toe without tenderness, erythema or warmth:    Neurologic: Awake, alert and oriented; motor function intact in all extremities and symmetric; no facial droop Skin: Warm and dry Psychiatric: Normal mood and affect   RESULTS  Summary of this visit's results, reviewed and interpreted by myself:   EKG Interpretation  Date/Time:    Ventricular Rate:    PR Interval:    QRS Duration:   QT Interval:    QTC Calculation:   R Axis:     Text Interpretation:        Laboratory Studies: No results found for this or any previous visit (from the past 24 hour(s)). Imaging Studies: No results found.  ED COURSE and MDM  Nursing notes, initial and subsequent vitals signs, including pulse oximetry, reviewed and interpreted by myself.  Vitals:   08/08/20 2231 08/08/20 2232  BP:  (!) 145/100  Pulse:  85  Resp:  20  Temp:  99.2 F (37.3 C)  TempSrc:  Oral  SpO2:  95%  Weight: 74.4 kg   Height: 5\' 10"  (1.778 m)    Medications  mupirocin cream (BACTROBAN) 2 % (has no administration in time range)    Blister of right great toe aspirated with 23-gauge needle.  Fluid obtained was clear without color or cloudiness.  The cause of the blisters is unclear but may be due to shoe friction in the setting of decreased sensation.  There are no signs of infection such as tenderness, erythema or warmth and the fluid obtained was completely clear.  We will provide topical mupirocin as they continue to heal.  PROCEDURES  Procedures   ED DIAGNOSES     ICD-10-CM   1. Blister of great toe of right foot, initial encounter  S90.421A   2. Blister of second toe  S90.426A        Maurissa Ambrose, Jenny Reichmann, MD 08/09/20 [phone]

## 2020-08-13 DIAGNOSIS — N2581 Secondary hyperparathyroidism of renal origin: Secondary | ICD-10-CM | POA: Diagnosis not present

## 2020-08-13 DIAGNOSIS — Z992 Dependence on renal dialysis: Secondary | ICD-10-CM | POA: Diagnosis not present

## 2020-08-13 DIAGNOSIS — N186 End stage renal disease: Secondary | ICD-10-CM | POA: Diagnosis not present

## 2020-08-14 DIAGNOSIS — Z1159 Encounter for screening for other viral diseases: Secondary | ICD-10-CM | POA: Diagnosis not present

## 2020-08-15 DIAGNOSIS — Z992 Dependence on renal dialysis: Secondary | ICD-10-CM | POA: Diagnosis not present

## 2020-08-15 DIAGNOSIS — N2581 Secondary hyperparathyroidism of renal origin: Secondary | ICD-10-CM | POA: Diagnosis not present

## 2020-08-15 DIAGNOSIS — N186 End stage renal disease: Secondary | ICD-10-CM | POA: Diagnosis not present

## 2020-08-19 DIAGNOSIS — Z992 Dependence on renal dialysis: Secondary | ICD-10-CM | POA: Diagnosis not present

## 2020-08-19 DIAGNOSIS — N186 End stage renal disease: Secondary | ICD-10-CM | POA: Diagnosis not present

## 2020-08-19 DIAGNOSIS — N2581 Secondary hyperparathyroidism of renal origin: Secondary | ICD-10-CM | POA: Diagnosis not present

## 2020-08-21 DIAGNOSIS — Z992 Dependence on renal dialysis: Secondary | ICD-10-CM | POA: Diagnosis not present

## 2020-08-21 DIAGNOSIS — N186 End stage renal disease: Secondary | ICD-10-CM | POA: Diagnosis not present

## 2020-08-21 DIAGNOSIS — N2581 Secondary hyperparathyroidism of renal origin: Secondary | ICD-10-CM | POA: Diagnosis not present

## 2020-08-23 DIAGNOSIS — N2581 Secondary hyperparathyroidism of renal origin: Secondary | ICD-10-CM | POA: Diagnosis not present

## 2020-08-23 DIAGNOSIS — Z992 Dependence on renal dialysis: Secondary | ICD-10-CM | POA: Diagnosis not present

## 2020-08-23 DIAGNOSIS — N186 End stage renal disease: Secondary | ICD-10-CM | POA: Diagnosis not present

## 2020-08-26 DIAGNOSIS — E1122 Type 2 diabetes mellitus with diabetic chronic kidney disease: Secondary | ICD-10-CM | POA: Diagnosis not present

## 2020-08-26 DIAGNOSIS — N2581 Secondary hyperparathyroidism of renal origin: Secondary | ICD-10-CM | POA: Diagnosis not present

## 2020-08-26 DIAGNOSIS — Z992 Dependence on renal dialysis: Secondary | ICD-10-CM | POA: Diagnosis not present

## 2020-08-26 DIAGNOSIS — N186 End stage renal disease: Secondary | ICD-10-CM | POA: Diagnosis not present

## 2020-08-28 DIAGNOSIS — Z992 Dependence on renal dialysis: Secondary | ICD-10-CM | POA: Diagnosis not present

## 2020-08-28 DIAGNOSIS — N2581 Secondary hyperparathyroidism of renal origin: Secondary | ICD-10-CM | POA: Diagnosis not present

## 2020-08-28 DIAGNOSIS — N186 End stage renal disease: Secondary | ICD-10-CM | POA: Diagnosis not present

## 2020-08-30 DIAGNOSIS — Z992 Dependence on renal dialysis: Secondary | ICD-10-CM | POA: Diagnosis not present

## 2020-08-30 DIAGNOSIS — N186 End stage renal disease: Secondary | ICD-10-CM | POA: Diagnosis not present

## 2020-08-30 DIAGNOSIS — N2581 Secondary hyperparathyroidism of renal origin: Secondary | ICD-10-CM | POA: Diagnosis not present

## 2020-09-02 DIAGNOSIS — N2581 Secondary hyperparathyroidism of renal origin: Secondary | ICD-10-CM | POA: Diagnosis not present

## 2020-09-02 DIAGNOSIS — N186 End stage renal disease: Secondary | ICD-10-CM | POA: Diagnosis not present

## 2020-09-02 DIAGNOSIS — Z992 Dependence on renal dialysis: Secondary | ICD-10-CM | POA: Diagnosis not present

## 2020-09-03 ENCOUNTER — Encounter: Payer: Self-pay | Admitting: Podiatrist

## 2020-09-03 ENCOUNTER — Ambulatory Visit (INDEPENDENT_AMBULATORY_CARE_PROVIDER_SITE_OTHER): Payer: Medicare HMO

## 2020-09-03 ENCOUNTER — Other Ambulatory Visit: Payer: Self-pay | Admitting: Podiatrist

## 2020-09-03 ENCOUNTER — Ambulatory Visit (INDEPENDENT_AMBULATORY_CARE_PROVIDER_SITE_OTHER): Payer: Medicare HMO | Admitting: Podiatrist

## 2020-09-03 ENCOUNTER — Other Ambulatory Visit: Payer: Self-pay

## 2020-09-03 DIAGNOSIS — L89892 Pressure ulcer of other site, stage 2: Secondary | ICD-10-CM

## 2020-09-03 DIAGNOSIS — B342 Coronavirus infection, unspecified: Secondary | ICD-10-CM | POA: Insufficient documentation

## 2020-09-03 DIAGNOSIS — B338 Other specified viral diseases: Secondary | ICD-10-CM | POA: Insufficient documentation

## 2020-09-03 DIAGNOSIS — E11621 Type 2 diabetes mellitus with foot ulcer: Secondary | ICD-10-CM

## 2020-09-03 DIAGNOSIS — B974 Respiratory syncytial virus as the cause of diseases classified elsewhere: Secondary | ICD-10-CM | POA: Insufficient documentation

## 2020-09-03 DIAGNOSIS — H3320 Serous retinal detachment, unspecified eye: Secondary | ICD-10-CM | POA: Insufficient documentation

## 2020-09-03 DIAGNOSIS — L97519 Non-pressure chronic ulcer of other part of right foot with unspecified severity: Secondary | ICD-10-CM

## 2020-09-03 NOTE — Patient Instructions (Signed)
Keep the toe clean and apply antibiotic ointment with or without a bandaid for the next 1-2 weeks until full healing has occurred.

## 2020-09-03 NOTE — Progress Notes (Signed)
HPI: Patient is 50 y.o. male who presents today for a concern over a previous blister on his right great toe distal medial side-  He states his right second toe became swollen and then his great toe developed a blister.  He was seen at urgent care 2 weeks ago where the lesion was popped and clear fluid came out. He relates no pain.  Relates the toe is improved but he wants to make sure it is getting better.    Patient Active Problem List   Diagnosis Date Noted  . Coronavirus infection 09/03/2020  . Detached retina 09/03/2020  . RSV (respiratory syncytial virus infection) 09/03/2020  . Anaphylactic shock, unspecified, sequela 05/05/2019  . Encounter for removal of sutures 07/06/2018  . Iron deficiency anemia, unspecified 07/01/2018  . Angina at rest [facility]) 02/10/2018  . Unspecified protein-calorie malnutrition ([place]) 11/19/2017  . Encounter for immunization 01/15/2017  . Shortness of breath 12/26/2016  . Dependence on renal dialysis ([place]) 12/14/2016  . Coagulation defect, unspecified ([place]) 12/10/2016  . Anemia in chronic kidney disease 12/08/2016  . ESRD (end stage renal disease) ([place]) 12/08/2016  . Hyperkalemia 12/08/2016  . Iron deficiency 12/08/2016  . Secondary hyperparathyroidism of renal origin ([place]) 12/08/2016  . Diabetes mellitus type 2, diet-controlled ([place]) 07/14/2016  . Essential hypertension 07/14/2016  . Anemia 07/14/2016  . Thrombocytopenia ([place]) 07/14/2016  . Multifocal pneumonia 07/14/2016  . Acute renal failure ([place]) 07/14/2016  . DIABETES MELLITUS II, UNCOMPLICATED [id number]  . OBESITY, NOS 09/23/2006  . DRUG DEPENDENCE 09/23/2006  . HYPERTENSION, BENIGN SYSTEMIC 09/23/2006    Current Outpatient Medications on File Prior to Visit  Medication Sig Dispense Refill  . amLODipine (NORVASC) 10 MG tablet Take 10 mg by mouth daily at 2 PM.    . b complex-vitamin c-folic acid (NEPHRO-VITE) 0.8 MG TABS tablet Take 1 tablet by mouth daily.    . hydrALAZINE (APRESOLINE)  50 MG tablet Take 50 mg by mouth 2 (two) times daily.    . Methoxy PEG-Epoetin Beta (MIRCERA IJ) Mircera    . sevelamer (RENAGEL) 800 MG tablet Take by mouth.    . sevelamer carbonate (RENVELA) 800 MG tablet Take by mouth.     No current facility-administered medications on file prior to visit.    Allergies  Allergen Reactions  . No Known Allergies     Review of Systems No fevers, chills, nausea, muscle aches, no difficulty breathing, no calf pain, no chest pain or shortness of breath.   Physical Exam  GENERAL APPEARANCE: Alert, conversant. Appropriately groomed. No acute distress.   VASCULAR: Pedal pulses palpable DP and PT bilateral.  Capillary refill time is immediate to all digits,  Proximal to distal cooling it warm to warm.  Digital perfusion adequate.   NEUROLOGIC: sensation is intact to 5.07 monofilament at 5/5 sites bilateral.  Light touch is intact bilateral, vibratory sensation intact bilateral  MUSCULOSKELETAL: acceptable muscle strength, tone and stability bilateral.  No gross boney pedal deformities noted.  No pain, crepitus or limitation noted with foot and ankle range of motion bilateral.   DERMATOLOGIC: skin overall is warm, supple, and dry.  Small oval 83m x 3570mx 70m53mlceration/ excoriation is present on the distal medial side of the right hallux.  No redness, no swelling, no fluctuance,  No drainage noted.  No pus seen.  No sign of infection noted.   Radiographic exam:  No sign of fracture or dislocation on the right great toe.  No lytic changes within the hallux- distal  or proximal phalanx-  No sign of bone infection.  Normal osseous mineralization.  No fracture or dislocation or acute osseous abnormalities present.  Joint spaces are normal.      Assessment     ICD-10-CM   1. Pressure injury of toe of right foot, stage 2 ([place])  [id number]      Plan  Recommended he continue to cleanse the toe with soap and water and dress the toe with antibiotic and a  bandaid until full healing of the toe has occurred- in 2-3 weeks.  He will continue to wear shoes that are not tight on his toes.  He will inspect the toe daily for any changes and if he notices any increased redness, swelling, blistering, or worsening of the ulcer he will call to be seen.  Otherwise he will return as needed.

## 2020-09-04 DIAGNOSIS — N186 End stage renal disease: Secondary | ICD-10-CM | POA: Diagnosis not present

## 2020-09-04 DIAGNOSIS — Z992 Dependence on renal dialysis: Secondary | ICD-10-CM | POA: Diagnosis not present

## 2020-09-04 DIAGNOSIS — N2581 Secondary hyperparathyroidism of renal origin: Secondary | ICD-10-CM | POA: Diagnosis not present

## 2020-09-05 DIAGNOSIS — E113512 Type 2 diabetes mellitus with proliferative diabetic retinopathy with macular edema, left eye: Secondary | ICD-10-CM | POA: Diagnosis not present

## 2020-09-10 DIAGNOSIS — E113511 Type 2 diabetes mellitus with proliferative diabetic retinopathy with macular edema, right eye: Secondary | ICD-10-CM | POA: Diagnosis not present

## 2020-09-11 DIAGNOSIS — N186 End stage renal disease: Secondary | ICD-10-CM | POA: Diagnosis not present

## 2020-09-11 DIAGNOSIS — Z992 Dependence on renal dialysis: Secondary | ICD-10-CM | POA: Diagnosis not present

## 2020-09-11 DIAGNOSIS — N2581 Secondary hyperparathyroidism of renal origin: Secondary | ICD-10-CM | POA: Diagnosis not present

## 2020-09-13 DIAGNOSIS — N186 End stage renal disease: Secondary | ICD-10-CM | POA: Diagnosis not present

## 2020-09-13 DIAGNOSIS — N2581 Secondary hyperparathyroidism of renal origin: Secondary | ICD-10-CM | POA: Diagnosis not present

## 2020-09-13 DIAGNOSIS — Z992 Dependence on renal dialysis: Secondary | ICD-10-CM | POA: Diagnosis not present

## 2020-09-16 DIAGNOSIS — Z992 Dependence on renal dialysis: Secondary | ICD-10-CM | POA: Diagnosis not present

## 2020-09-16 DIAGNOSIS — N186 End stage renal disease: Secondary | ICD-10-CM | POA: Diagnosis not present

## 2020-09-16 DIAGNOSIS — N2581 Secondary hyperparathyroidism of renal origin: Secondary | ICD-10-CM | POA: Diagnosis not present

## 2020-09-18 DIAGNOSIS — N186 End stage renal disease: Secondary | ICD-10-CM | POA: Diagnosis not present

## 2020-09-18 DIAGNOSIS — N2581 Secondary hyperparathyroidism of renal origin: Secondary | ICD-10-CM | POA: Diagnosis not present

## 2020-09-18 DIAGNOSIS — Z992 Dependence on renal dialysis: Secondary | ICD-10-CM | POA: Diagnosis not present

## 2020-09-20 DIAGNOSIS — N2581 Secondary hyperparathyroidism of renal origin: Secondary | ICD-10-CM | POA: Diagnosis not present

## 2020-09-20 DIAGNOSIS — N186 End stage renal disease: Secondary | ICD-10-CM | POA: Diagnosis not present

## 2020-09-20 DIAGNOSIS — Z992 Dependence on renal dialysis: Secondary | ICD-10-CM | POA: Diagnosis not present

## 2020-09-23 DIAGNOSIS — N186 End stage renal disease: Secondary | ICD-10-CM | POA: Diagnosis not present

## 2020-09-23 DIAGNOSIS — N2581 Secondary hyperparathyroidism of renal origin: Secondary | ICD-10-CM | POA: Diagnosis not present

## 2020-09-23 DIAGNOSIS — E1122 Type 2 diabetes mellitus with diabetic chronic kidney disease: Secondary | ICD-10-CM | POA: Diagnosis not present

## 2020-09-23 DIAGNOSIS — Z992 Dependence on renal dialysis: Secondary | ICD-10-CM | POA: Diagnosis not present

## 2020-09-25 DIAGNOSIS — N186 End stage renal disease: Secondary | ICD-10-CM | POA: Diagnosis not present

## 2020-09-25 DIAGNOSIS — N2581 Secondary hyperparathyroidism of renal origin: Secondary | ICD-10-CM | POA: Diagnosis not present

## 2020-09-25 DIAGNOSIS — Z992 Dependence on renal dialysis: Secondary | ICD-10-CM | POA: Diagnosis not present

## 2020-09-30 DIAGNOSIS — Z992 Dependence on renal dialysis: Secondary | ICD-10-CM | POA: Diagnosis not present

## 2020-09-30 DIAGNOSIS — N186 End stage renal disease: Secondary | ICD-10-CM | POA: Diagnosis not present

## 2020-09-30 DIAGNOSIS — N2581 Secondary hyperparathyroidism of renal origin: Secondary | ICD-10-CM | POA: Diagnosis not present

## 2020-10-04 DIAGNOSIS — Z992 Dependence on renal dialysis: Secondary | ICD-10-CM | POA: Diagnosis not present

## 2020-10-04 DIAGNOSIS — N186 End stage renal disease: Secondary | ICD-10-CM | POA: Diagnosis not present

## 2020-10-04 DIAGNOSIS — N2581 Secondary hyperparathyroidism of renal origin: Secondary | ICD-10-CM | POA: Diagnosis not present

## 2020-10-07 DIAGNOSIS — N186 End stage renal disease: Secondary | ICD-10-CM | POA: Diagnosis not present

## 2020-10-07 DIAGNOSIS — N2581 Secondary hyperparathyroidism of renal origin: Secondary | ICD-10-CM | POA: Diagnosis not present

## 2020-10-07 DIAGNOSIS — Z992 Dependence on renal dialysis: Secondary | ICD-10-CM | POA: Diagnosis not present

## 2020-10-09 DIAGNOSIS — Z992 Dependence on renal dialysis: Secondary | ICD-10-CM | POA: Diagnosis not present

## 2020-10-09 DIAGNOSIS — N2581 Secondary hyperparathyroidism of renal origin: Secondary | ICD-10-CM | POA: Diagnosis not present

## 2020-10-09 DIAGNOSIS — N186 End stage renal disease: Secondary | ICD-10-CM | POA: Diagnosis not present

## 2020-10-11 DIAGNOSIS — N186 End stage renal disease: Secondary | ICD-10-CM | POA: Diagnosis not present

## 2020-10-11 DIAGNOSIS — N2581 Secondary hyperparathyroidism of renal origin: Secondary | ICD-10-CM | POA: Diagnosis not present

## 2020-10-11 DIAGNOSIS — Z992 Dependence on renal dialysis: Secondary | ICD-10-CM | POA: Diagnosis not present

## 2020-10-14 DIAGNOSIS — N2581 Secondary hyperparathyroidism of renal origin: Secondary | ICD-10-CM | POA: Diagnosis not present

## 2020-10-14 DIAGNOSIS — N186 End stage renal disease: Secondary | ICD-10-CM | POA: Diagnosis not present

## 2020-10-14 DIAGNOSIS — Z992 Dependence on renal dialysis: Secondary | ICD-10-CM | POA: Diagnosis not present

## 2020-10-15 DIAGNOSIS — E113512 Type 2 diabetes mellitus with proliferative diabetic retinopathy with macular edema, left eye: Secondary | ICD-10-CM | POA: Diagnosis not present

## 2020-10-18 DIAGNOSIS — Z992 Dependence on renal dialysis: Secondary | ICD-10-CM | POA: Diagnosis not present

## 2020-10-18 DIAGNOSIS — N2581 Secondary hyperparathyroidism of renal origin: Secondary | ICD-10-CM | POA: Diagnosis not present

## 2020-10-18 DIAGNOSIS — N186 End stage renal disease: Secondary | ICD-10-CM | POA: Diagnosis not present

## 2020-10-21 DIAGNOSIS — N2581 Secondary hyperparathyroidism of renal origin: Secondary | ICD-10-CM | POA: Diagnosis not present

## 2020-10-21 DIAGNOSIS — Z992 Dependence on renal dialysis: Secondary | ICD-10-CM | POA: Diagnosis not present

## 2020-10-21 DIAGNOSIS — N186 End stage renal disease: Secondary | ICD-10-CM | POA: Diagnosis not present

## 2020-10-24 DIAGNOSIS — Z992 Dependence on renal dialysis: Secondary | ICD-10-CM | POA: Diagnosis not present

## 2020-10-24 DIAGNOSIS — N186 End stage renal disease: Secondary | ICD-10-CM | POA: Diagnosis not present

## 2020-10-24 DIAGNOSIS — E1122 Type 2 diabetes mellitus with diabetic chronic kidney disease: Secondary | ICD-10-CM | POA: Diagnosis not present

## 2020-10-25 DIAGNOSIS — N2581 Secondary hyperparathyroidism of renal origin: Secondary | ICD-10-CM | POA: Diagnosis not present

## 2020-10-25 DIAGNOSIS — Z992 Dependence on renal dialysis: Secondary | ICD-10-CM | POA: Diagnosis not present

## 2020-10-25 DIAGNOSIS — N186 End stage renal disease: Secondary | ICD-10-CM | POA: Diagnosis not present

## 2020-10-28 DIAGNOSIS — N2581 Secondary hyperparathyroidism of renal origin: Secondary | ICD-10-CM | POA: Diagnosis not present

## 2020-10-28 DIAGNOSIS — N186 End stage renal disease: Secondary | ICD-10-CM | POA: Diagnosis not present

## 2020-10-28 DIAGNOSIS — Z992 Dependence on renal dialysis: Secondary | ICD-10-CM | POA: Diagnosis not present

## 2020-11-01 DIAGNOSIS — Z992 Dependence on renal dialysis: Secondary | ICD-10-CM | POA: Diagnosis not present

## 2020-11-01 DIAGNOSIS — N2581 Secondary hyperparathyroidism of renal origin: Secondary | ICD-10-CM | POA: Diagnosis not present

## 2020-11-01 DIAGNOSIS — N186 End stage renal disease: Secondary | ICD-10-CM | POA: Diagnosis not present

## 2020-11-04 DIAGNOSIS — N186 End stage renal disease: Secondary | ICD-10-CM | POA: Diagnosis not present

## 2020-11-04 DIAGNOSIS — N2581 Secondary hyperparathyroidism of renal origin: Secondary | ICD-10-CM | POA: Diagnosis not present

## 2020-11-04 DIAGNOSIS — Z992 Dependence on renal dialysis: Secondary | ICD-10-CM | POA: Diagnosis not present

## 2020-11-08 DIAGNOSIS — N2581 Secondary hyperparathyroidism of renal origin: Secondary | ICD-10-CM | POA: Diagnosis not present

## 2020-11-08 DIAGNOSIS — Z992 Dependence on renal dialysis: Secondary | ICD-10-CM | POA: Diagnosis not present

## 2020-11-08 DIAGNOSIS — N186 End stage renal disease: Secondary | ICD-10-CM | POA: Diagnosis not present

## 2020-11-11 DIAGNOSIS — Z992 Dependence on renal dialysis: Secondary | ICD-10-CM | POA: Diagnosis not present

## 2020-11-11 DIAGNOSIS — N186 End stage renal disease: Secondary | ICD-10-CM | POA: Diagnosis not present

## 2020-11-11 DIAGNOSIS — N2581 Secondary hyperparathyroidism of renal origin: Secondary | ICD-10-CM | POA: Diagnosis not present

## 2020-11-13 DIAGNOSIS — N2581 Secondary hyperparathyroidism of renal origin: Secondary | ICD-10-CM | POA: Diagnosis not present

## 2020-11-13 DIAGNOSIS — Z992 Dependence on renal dialysis: Secondary | ICD-10-CM | POA: Diagnosis not present

## 2020-11-13 DIAGNOSIS — N186 End stage renal disease: Secondary | ICD-10-CM | POA: Diagnosis not present

## 2020-11-18 DIAGNOSIS — N2581 Secondary hyperparathyroidism of renal origin: Secondary | ICD-10-CM | POA: Diagnosis not present

## 2020-11-18 DIAGNOSIS — N186 End stage renal disease: Secondary | ICD-10-CM | POA: Diagnosis not present

## 2020-11-18 DIAGNOSIS — Z992 Dependence on renal dialysis: Secondary | ICD-10-CM | POA: Diagnosis not present

## 2020-11-20 DIAGNOSIS — N186 End stage renal disease: Secondary | ICD-10-CM | POA: Diagnosis not present

## 2020-11-20 DIAGNOSIS — Z992 Dependence on renal dialysis: Secondary | ICD-10-CM | POA: Diagnosis not present

## 2020-11-20 DIAGNOSIS — N2581 Secondary hyperparathyroidism of renal origin: Secondary | ICD-10-CM | POA: Diagnosis not present

## 2020-11-23 DIAGNOSIS — E1122 Type 2 diabetes mellitus with diabetic chronic kidney disease: Secondary | ICD-10-CM | POA: Diagnosis not present

## 2020-11-23 DIAGNOSIS — Z992 Dependence on renal dialysis: Secondary | ICD-10-CM | POA: Diagnosis not present

## 2020-11-23 DIAGNOSIS — N186 End stage renal disease: Secondary | ICD-10-CM | POA: Diagnosis not present

## 2020-11-25 DIAGNOSIS — N2581 Secondary hyperparathyroidism of renal origin: Secondary | ICD-10-CM | POA: Diagnosis not present

## 2020-11-25 DIAGNOSIS — Z992 Dependence on renal dialysis: Secondary | ICD-10-CM | POA: Diagnosis not present

## 2020-11-25 DIAGNOSIS — N186 End stage renal disease: Secondary | ICD-10-CM | POA: Diagnosis not present

## 2020-11-27 DIAGNOSIS — Z992 Dependence on renal dialysis: Secondary | ICD-10-CM | POA: Diagnosis not present

## 2020-11-27 DIAGNOSIS — N186 End stage renal disease: Secondary | ICD-10-CM | POA: Diagnosis not present

## 2020-11-27 DIAGNOSIS — N2581 Secondary hyperparathyroidism of renal origin: Secondary | ICD-10-CM | POA: Diagnosis not present

## 2020-12-02 DIAGNOSIS — N186 End stage renal disease: Secondary | ICD-10-CM | POA: Diagnosis not present

## 2020-12-02 DIAGNOSIS — Z992 Dependence on renal dialysis: Secondary | ICD-10-CM | POA: Diagnosis not present

## 2020-12-02 DIAGNOSIS — N2581 Secondary hyperparathyroidism of renal origin: Secondary | ICD-10-CM | POA: Diagnosis not present

## 2020-12-04 DIAGNOSIS — N186 End stage renal disease: Secondary | ICD-10-CM | POA: Diagnosis not present

## 2020-12-04 DIAGNOSIS — Z992 Dependence on renal dialysis: Secondary | ICD-10-CM | POA: Diagnosis not present

## 2020-12-04 DIAGNOSIS — N2581 Secondary hyperparathyroidism of renal origin: Secondary | ICD-10-CM | POA: Diagnosis not present

## 2020-12-05 DIAGNOSIS — Z992 Dependence on renal dialysis: Secondary | ICD-10-CM | POA: Diagnosis not present

## 2020-12-05 DIAGNOSIS — N186 End stage renal disease: Secondary | ICD-10-CM | POA: Diagnosis not present

## 2020-12-05 DIAGNOSIS — N2581 Secondary hyperparathyroidism of renal origin: Secondary | ICD-10-CM | POA: Diagnosis not present

## 2020-12-11 DIAGNOSIS — Z992 Dependence on renal dialysis: Secondary | ICD-10-CM | POA: Diagnosis not present

## 2020-12-11 DIAGNOSIS — N186 End stage renal disease: Secondary | ICD-10-CM | POA: Diagnosis not present

## 2020-12-11 DIAGNOSIS — N2581 Secondary hyperparathyroidism of renal origin: Secondary | ICD-10-CM | POA: Diagnosis not present

## 2020-12-13 DIAGNOSIS — Z992 Dependence on renal dialysis: Secondary | ICD-10-CM | POA: Diagnosis not present

## 2020-12-13 DIAGNOSIS — N2581 Secondary hyperparathyroidism of renal origin: Secondary | ICD-10-CM | POA: Diagnosis not present

## 2020-12-13 DIAGNOSIS — N186 End stage renal disease: Secondary | ICD-10-CM | POA: Diagnosis not present

## 2020-12-16 DIAGNOSIS — N2581 Secondary hyperparathyroidism of renal origin: Secondary | ICD-10-CM | POA: Diagnosis not present

## 2020-12-16 DIAGNOSIS — N186 End stage renal disease: Secondary | ICD-10-CM | POA: Diagnosis not present

## 2020-12-16 DIAGNOSIS — Z992 Dependence on renal dialysis: Secondary | ICD-10-CM | POA: Diagnosis not present

## 2020-12-18 DIAGNOSIS — N2581 Secondary hyperparathyroidism of renal origin: Secondary | ICD-10-CM | POA: Diagnosis not present

## 2020-12-18 DIAGNOSIS — Z992 Dependence on renal dialysis: Secondary | ICD-10-CM | POA: Diagnosis not present

## 2020-12-18 DIAGNOSIS — N186 End stage renal disease: Secondary | ICD-10-CM | POA: Diagnosis not present

## 2020-12-19 DIAGNOSIS — E113511 Type 2 diabetes mellitus with proliferative diabetic retinopathy with macular edema, right eye: Secondary | ICD-10-CM | POA: Diagnosis not present

## 2020-12-23 DIAGNOSIS — N186 End stage renal disease: Secondary | ICD-10-CM | POA: Diagnosis not present

## 2020-12-23 DIAGNOSIS — N2581 Secondary hyperparathyroidism of renal origin: Secondary | ICD-10-CM | POA: Diagnosis not present

## 2020-12-23 DIAGNOSIS — Z992 Dependence on renal dialysis: Secondary | ICD-10-CM | POA: Diagnosis not present

## 2020-12-24 DIAGNOSIS — Z992 Dependence on renal dialysis: Secondary | ICD-10-CM | POA: Diagnosis not present

## 2020-12-24 DIAGNOSIS — N186 End stage renal disease: Secondary | ICD-10-CM | POA: Diagnosis not present

## 2020-12-24 DIAGNOSIS — E1122 Type 2 diabetes mellitus with diabetic chronic kidney disease: Secondary | ICD-10-CM | POA: Diagnosis not present

## 2021-04-08 ENCOUNTER — Encounter (HOSPITAL_COMMUNITY): Payer: Self-pay

## 2021-04-15 ENCOUNTER — Ambulatory Visit (INDEPENDENT_AMBULATORY_CARE_PROVIDER_SITE_OTHER): Payer: Medicare Other | Admitting: Primary Care

## 2021-04-15 ENCOUNTER — Other Ambulatory Visit: Payer: Self-pay

## 2021-04-15 ENCOUNTER — Encounter (INDEPENDENT_AMBULATORY_CARE_PROVIDER_SITE_OTHER): Payer: Self-pay | Admitting: Primary Care

## 2021-04-15 VITALS — BP 182/87 | HR 92 | Temp 97.3°F | Ht 70.0 in | Wt 164.6 lb

## 2021-04-15 DIAGNOSIS — Z131 Encounter for screening for diabetes mellitus: Secondary | ICD-10-CM | POA: Diagnosis not present

## 2021-04-15 DIAGNOSIS — Z7689 Persons encountering health services in other specified circumstances: Secondary | ICD-10-CM | POA: Diagnosis not present

## 2021-04-15 DIAGNOSIS — N186 End stage renal disease: Secondary | ICD-10-CM

## 2021-04-15 LAB — POCT GLYCOSYLATED HEMOGLOBIN (HGB A1C): Hemoglobin A1C: 4.7 % (ref 4.0–5.6)

## 2021-04-15 NOTE — Progress Notes (Signed)
[facility] Patient Office Visit  Subjective:  Patient ID: CASIMIER DUMITRESCU II, male    DOB: 11/02/1970  Age: 50 y.o. MRN: [id number]  CC:  Chief Complaint  Patient presents with   New Patient (Initial Visit)    HPI Mr.Gibril E Luepke II presents for establishment of care. He has an extensive history of Type 2 diabetes mellitus with diabetic chronic kidney disease , End stage renal disease and Dependence on renal dialysis . Currently on kidney transplant list   Past Medical History:  Diagnosis Date   Anemia    Detached retina, right    partial with blood in eye   Diabetes mellitus without complication ([place])    not on medication- Per kidney biospy   ESRD (end stage renal disease) ([place])    MWF   Hypertension    Pneumonia 06/2016    Past Surgical History:  Procedure Laterality Date   AIR/FLUID EXCHANGE Left 01/26/2017   Procedure: AIR/FLUID EXCHANGE left Eye;  Surgeon: Jalene Mullet, MD;  Location: [place];  Service: Ophthalmology;  Laterality: Left;   BASCILIC VEIN TRANSPOSITION Left 12/03/2016   Procedure: BASCILIC VEIN TRANSPOSITION-LEFT 1ST STAGE;  Surgeon: Serafina Mitchell, MD;  Location: [place];  Service: Vascular;  Laterality: Left;   BASCILIC VEIN TRANSPOSITION Left 01/28/2017   Procedure: BASCILIC VEIN TRANSPOSITION-LEFT 2ND STAGE;  Surgeon: Waynetta Sandy, MD;  Location: [place];  Service: Vascular;  Laterality: Left;   COLONOSCOPY     INSERTION OF DIALYSIS CATHETER Right 12/03/2016   Procedure: INSERTION OF DIALYSIS CATHETER;  Surgeon: Serafina Mitchell, MD;  Location: [place];  Service: Vascular;  Laterality: Right;   LASER PHOTO ABLATION Left 01/26/2017   Procedure: LASER PHOTO ABLATION left Eye;  Surgeon: Jalene Mullet, MD;  Location: [place];  Service: Ophthalmology;  Laterality: Left;   MEMBRANE PEEL Left 01/26/2017   Procedure: MEMBRANE PEEL left eye;  Surgeon: Jalene Mullet, MD;  Location: [place];  Service: Ophthalmology;  Laterality:  Left;   RENAL BIOPSY Left    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 11/26/2016   Procedure: REPAIR OF COMPLEX TRACTION RETINAL DETACHMENT;  Surgeon: Jalene Mullet, MD;  Location: [place];  Service: Ophthalmology;  Laterality: Right;   REPAIR OF COMPLEX TRACTION RETINAL DETACHMENT Left 01/26/2017   Procedure: REPAIR OF COMPLEX TRACTION RETINAL DETACHMENT LEFT EYE WITH VITRECTOMY;  Surgeon: Jalene Mullet, MD;  Location: [place];  Service: Ophthalmology;  Laterality: Left;    Family History  Problem Relation Age of Onset   Diabetes Mother    Diabetes Father     Social History   Socioeconomic History   Marital status: Married    Spouse name: Not on file   Number of children: Not on file   Years of education: Not on file   Highest education level: Not on file  Occupational History   Not on file  Tobacco Use   Smoking status: Never   Smokeless tobacco: Never  Vaping Use   Vaping Use: Never used  Substance and Sexual Activity   Alcohol use: No   Drug use: Yes    Types: Marijuana   Sexual activity: Not on file  Other Topics Concern   Not on file  Social History Narrative   Not on file   Social Determinants of Health   Financial Resource Strain: Not on file  Food Insecurity: Not on file  Transportation Needs: Not on file  Physical Activity:  Not on file  Stress: Not on file  Social Connections: Not on file  Intimate Partner Violence: Not on file    ROS Review of Systems  All other systems reviewed and are negative.  Objective:   Today's Vitals: BP (!) 182/87 (BP Location: Right Arm, Patient Position: Sitting, Cuff Size: Normal)   Pulse 92   Temp (!) 97.3 F (36.3 C) (Temporal)   Ht '5\' 10"'$  (1.778 m)   Wt 164 lb 9.6 oz (74.7 kg)   SpO2 97%   BMI 23.62 kg/m   Physical Exam General: No apparent distress. Eyes: Extraocular eye movements intact, pupils equal and round. Neck: Supple, trachea midline. Thyroid: No enlargement, mobile without fixation, no  tenderness. Cardiovascular: Regular rhythm and rate, no murmur, normal radial pulses. Respiratory: Normal respiratory effort, clear to auscultation. Gastrointestinal: Normal pitch active bowel sounds, nontender abdomen without distention or appreciable hepatomegaly. Neurologic:  no tremor,  Musculoskeletal: Normal muscle tone, no tenderness on palpation of tibia, no excessive thoracic kyphosis. Skin: Appropriate warmth, no visible rash. Mental status: Alert, conversant, speech clear, thought logical, appropriate mood and affect, no hallucinations or delusions evident. Hematologic/lymphatic: No cervical adenopathy, no visible ecchymoses.  Assessment & Plan:  [place] was seen today for new patient (initial visit).  Diagnoses and all orders for this visit:  Screening for diabetes mellitus -     HgB A1c 4.7   Encounter to establish care Patient is establishing care with PCP   ESRD (end stage renal disease) ([place]) On transplant list Bp elevated managed by nephrology  Outpatient Encounter Medications as of 04/15/2021  Medication Sig   amLODipine (NORVASC) 10 MG tablet Take 10 mg by mouth daily at 2 PM.   b complex-vitamin c-folic acid (NEPHRO-VITE) 0.8 MG TABS tablet Take 1 tablet by mouth daily.   Methoxy PEG-Epoetin Beta (MIRCERA IJ) Mircera   sevelamer (RENAGEL) 800 MG tablet Take by mouth.   sevelamer carbonate (RENVELA) 800 MG tablet Take by mouth.   hydrALAZINE (APRESOLINE) 100 MG tablet Take 100 mg by mouth 2 (two) times daily.   [DISCONTINUED] hydrALAZINE (APRESOLINE) 50 MG tablet Take 50 mg by mouth 2 (two) times daily.   No facility-administered encounter medications on file as of 04/15/2021.    Follow-up: Return if symptoms worsen or fail to improve.   Kerin Perna, NP

## 2021-04-15 NOTE — Patient Instructions (Signed)
Chronic Kidney Disease, Adult Chronic kidney disease (CKD) occurs when the kidneys are slowly and permanently damaged over a long period of time. The kidneys are a pair of organs that do many important jobs in the body, including: Removing waste and extra fluid from the blood to make urine. Making hormones that maintain the amount of fluid in tissues and blood vessels. Maintaining the right amount of fluids and chemicals in the body. A small amount of kidney damage may not cause problems, but a large amount of damage may make it hard or impossible for the kidneys to work right. Steps must be taken to slow kidney damage or to stop it from getting worse. If steps are not taken, the kidneys may stop working permanently (end-stage renal disease, or ESRD). Most of the time, CKD does not go away, but it can often becontrolled. People who have CKD are usually able to live full lives. What are the causes? The most common causes of this condition are diabetes and high blood pressure (hypertension). Other causes include: Cardiovascular diseases. These affect the heart and blood vessels. Kidney diseases. These include: Glomerulonephritis, or inflammation of the tiny filters in the kidneys. Interstitial nephritis. This is swelling of the small tubes of the kidneys and of the surrounding structures. Polycystic kidney disease, in which clusters of fluid-filled sacs form within the kidneys. Renal vascular disease. This includes disorders that affect the arteries and veins of the kidneys. Diseases that affect the body's defense system (immune system). A problem with urine flow. This may be caused by: Kidney stones. Cancer. An enlarged prostate, in males. A kidney infection or urinary tract infection (UTI) that keeps coming back. Vasculitis. This is swelling or inflammation of the blood vessels. What increases the risk? Your chances of having kidney disease increase with age. The following factors may make  you more likely to develop this condition: A family history of kidney disease or kidney failure. Kidney failure means the kidneys can no longer work right. Certain genetic diseases. Taking medicines often that are damaging to the kidneys. Being around or being in contact with toxic substances. Obesity. A history of tobacco use. What are the signs or symptoms? Symptoms of this condition include: Feeling very tired (lethargic) and having less energy. Swelling, or edema, of the face, legs, ankles, or feet. Nausea or vomiting, or loss of appetite. Confusion or trouble concentrating. Muscle twitches and cramps, especially in the legs. Dry, itchy skin. A metallic taste in the mouth. Producing less urine, or producing more urine (especially at night). Shortness of breath. Trouble sleeping. CKD may also result in not having enough red blood cells or hemoglobin in the blood (anemia) or having weak bones (bone disease). Symptoms develop slowly and may not be obvious until the kidney damage becomessevere. It is possible to have kidney disease for years without having symptoms. How is this diagnosed? This condition may be diagnosed based on: Blood tests. Urine tests. Imaging tests, such as an ultrasound or a CT scan. A kidney biopsy. This involves removing a sample of kidney tissue to be looked at under a microscope. Results from these tests will help to determine how serious the CKD is. How is this treated? There is no cure for most cases of this condition, but treatment usually relieves symptoms and prevents or slows the worsening of the disease. Treatment may include: Diet changes, which may require you to avoid alcohol and foods that are high in salt, potassium, phosphorous, and protein. Medicines. These may: Lower blood   pressure. Control blood sugar (glucose). Relieve anemia. Relieve swelling. Protect your bones. Improve the balance of salts and minerals in your blood  (electrolytes). Dialysis, which is a type of treatment that removes toxic waste from the body. It may be needed if you have kidney failure. Managing any other conditions that are causing your CKD or making it worse. Follow these instructions at home: Medicines Take over-the-counter and prescription medicines only as told by your health care provider. The amount of some medicines that you take may need to be changed. Do not take any new medicines unless approved by your health care provider. Many medicines can make kidney damage worse. Do not take any vitamin and mineral supplements unless approved by your health care provider. Many nutritional supplements can make kidney damage worse. Lifestyle  Do not use any products that contain nicotine or tobacco, such as cigarettes, e-cigarettes, and chewing tobacco. If you need help quitting, ask your health care provider. If you drink alcohol: Limit how much you use to: 0-1 drink a day for women who are not pregnant. 0-2 drinks a day for men. Know how much alcohol is in your drink. In the [place] one drink equals one 12 oz bottle of beer (355 mL), one 5 oz glass of wine (148 mL), or one 1 oz glass of hard liquor (44 mL). Maintain a healthy weight. If you need help, ask your health care provider.  General instructions  Follow instructions from your health care provider about eating or drinking restrictions, including any prescribed diet. Track your blood pressure at home. Report changes in your blood pressure as told. If you are being treated for diabetes, track your blood glucose levels as told. Start or continue an exercise plan. Exercise at least 30 minutes a day, 5 days a week. Keep your immunizations up to date as told. Keep all follow-up visits. This is important.  Where to find more information American Association of Kidney Patients: www.aakp.org National Kidney Foundation: www.kidney.org American Kidney Fund: www.akfinc.org Life Options:  www.lifeoptions.org Kidney School: www.kidneyschool.org Contact a health care provider if: Your symptoms get worse. You develop new symptoms. Get help right away if: You develop symptoms of ESRD. These include: Headaches. Numbness in your hands or feet. Easy bruising. Frequent hiccups. Chest pain. Shortness of breath. Lack of menstrual periods, in women. You have a fever. You are producing less urine than usual. You have pain or bleeding when you urinate or when you have a bowel movement. These symptoms may represent a serious problem that is an emergency. Do not wait to see if the symptoms will go away. Get medical help right away. Call your local emergency services (911 in the [place] Do not drive yourself to the hospital. Summary Chronic kidney disease (CKD) occurs when the kidneys become damaged slowly over a long period of time. The most common causes of this condition are diabetes and high blood pressure (hypertension). There is no cure for most cases of CKD, but treatment usually relieves symptoms and prevents or slows the worsening of the disease. Treatment may include a combination of lifestyle changes, medicines, and dialysis. This information is not intended to replace advice given to you by your health care provider. Make sure you discuss any questions you have with your healthcare provider. Document Revised: 10/18/2019 Document Reviewed: 10/18/2019 Elsevier Patient Education  2022 Elsevier Inc.  
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
## 2021-10-24 ENCOUNTER — Ambulatory Visit (INDEPENDENT_AMBULATORY_CARE_PROVIDER_SITE_OTHER): Payer: Medicare Other

## 2021-10-24 ENCOUNTER — Encounter (INDEPENDENT_AMBULATORY_CARE_PROVIDER_SITE_OTHER): Payer: Self-pay

## 2021-10-24 DIAGNOSIS — Z0000 Encounter for general adult medical examination without abnormal findings: Secondary | ICD-10-CM

## 2021-10-24 NOTE — Patient Instructions (Signed)

## 2021-10-24 NOTE — Progress Notes (Signed)
? ?Subjective:  ? Casey Preston is a 51 y.o. male who presents for an Initial Medicare Annual Wellness Visit. ?I connected with  Casey Preston on 10/24/21 by a audio enabled telemedicine application and verified that I am speaking with the correct person using two identifiers. ? ?Patient Location: Home ? ?Provider Location: Home Office ? ?I discussed the limitations of evaluation and management by telemedicine. The patient expressed understanding and agreed to proceed.  ? ?   ?Objective:  ?  ?Today's Vitals  ? ?There is no height or weight on file to calculate BMI. ? ? ?  08/08/2020  ? 10:31 PM 05/10/2020  ?  8:37 AM 06/17/2019  ?  3:36 AM 01/28/2017  ?  5:52 AM 12/03/2016  ?  6:27 AM 11/26/2016  ? 10:57 AM 11/12/2016  ?  6:25 AM  ?Advanced Directives  ?Does Patient Have a Medical Advance Directive? No No No No No Yes No  ?Would patient like information on creating a medical advance directive?  No - Patient declined  No - Patient declined No - Patient declined  No - Patient declined  ? ? ?Current Medications (verified) ?Outpatient Encounter Medications as of 10/24/2021  ?Medication Sig  ? amLODipine (NORVASC) 10 MG tablet Take 10 mg by mouth daily at 2 PM.  ? b complex-vitamin c-folic acid (NEPHRO-VITE) 0.8 MG TABS tablet Take 1 tablet by mouth daily.  ? hydrALAZINE (APRESOLINE) 100 MG tablet Take 100 mg by mouth 2 (two) times daily.  ? sevelamer (RENAGEL) 800 MG tablet Take by mouth. (Patient not taking: Reported on 10/24/2021)  ? sevelamer carbonate (RENVELA) 800 MG tablet Take by mouth. (Patient not taking: Reported on 10/24/2021)  ? ?No facility-administered encounter medications on file as of 10/24/2021.  ? ? ?Allergies (verified) ?No known allergies  ? ?History: ?Past Medical History:  ?Diagnosis Date  ? Anemia   ? Detached retina, right   ? partial with blood in eye  ? Diabetes mellitus without complication ([place])   ? not on medication- Per kidney biospy  ? ESRD (end stage renal disease) ([place])   ? MWF  ?  Hypertension   ? Pneumonia 06/2016  ? ?Past Surgical History:  ?Procedure Laterality Date  ? AIR/FLUID EXCHANGE Left 01/26/2017  ? Procedure: AIR/FLUID EXCHANGE left Eye;  Surgeon: Jalene Mullet, MD;  Location: [place];  Service: Ophthalmology;  Laterality: Left;  ? BASCILIC VEIN TRANSPOSITION Left 12/03/2016  ? Procedure: BASCILIC VEIN TRANSPOSITION-LEFT 1ST STAGE;  Surgeon: Serafina Mitchell, MD;  Location: [place];  Service: Vascular;  Laterality: Left;  ? BASCILIC VEIN TRANSPOSITION Left 01/28/2017  ? Procedure: BASCILIC VEIN TRANSPOSITION-LEFT 2ND STAGE;  Surgeon: Waynetta Sandy, MD;  Location: [place];  Service: Vascular;  Laterality: Left;  ? COLONOSCOPY    ? INSERTION OF DIALYSIS CATHETER Right 12/03/2016  ? Procedure: INSERTION OF DIALYSIS CATHETER;  Surgeon: Serafina Mitchell, MD;  Location: [place];  Service: Vascular;  Laterality: Right;  ? LASER PHOTO ABLATION Left 01/26/2017  ? Procedure: LASER PHOTO ABLATION left Eye;  Surgeon: Jalene Mullet, MD;  Location: [place];  Service: Ophthalmology;  Laterality: Left;  ? MEMBRANE PEEL Left 01/26/2017  ? Procedure: MEMBRANE PEEL left eye;  Surgeon: Jalene Mullet, MD;  Location: [place];  Service: Ophthalmology;  Laterality: Left;  ? RENAL BIOPSY Left   ? 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 Procedure: REPAIR OF COMPLEX TRACTION RETINAL DETACHMENT;  Surgeon: Jalene Mullet, MD;  [facility];  Service: Ophthalmology;  Laterality: Right;  ? REPAIR OF COMPLEX TRACTION RETINAL DETACHMENT Left 01/26/2017  ? Procedure: REPAIR OF COMPLEX TRACTION RETINAL DETACHMENT LEFT EYE WITH VITRECTOMY;  Surgeon: Jalene Mullet, MD;  Location: [place];  Service: Ophthalmology;  Laterality: Left;  ? ?Family History  ?Problem Relation Age of Onset  ? Diabetes Mother   ? Diabetes Father   ? ?Social History  ? ?Socioeconomic History  ? Marital status: Married  ?  Spouse name: Not on file  ? Number of children: Not on file  ? Years of education: Not on file  ?  Highest education level: Not on file  ?Occupational History  ? Not on file  ?Tobacco Use  ? Smoking status: Never  ? Smokeless tobacco: Never  ?Vaping Use  ? Vaping Use: Never used  ?Substance and Sexual Activity  ? Alcohol use: No  ? Drug use: Yes  ?  Types: Marijuana  ? Sexual activity: Not on file  ?Other Topics Concern  ? Not on file  ?Social History Narrative  ? Not on file  ? ?Social Determinants of Health  ? ?Financial Resource Strain: Not on file  ?Food Insecurity: No Food Insecurity  ? Worried About Charity fundraiser in the Last Year: Never true  ? Ran Out of Food in the Last Year: Never true  ?Transportation Needs: No Transportation Needs  ? Lack of Transportation (Medical): No  ? Lack of Transportation (Non-Medical): No  ?Physical Activity: Sufficiently Active  ? Days of Exercise per Week: 6 days  ? Minutes of Exercise per Session: 120 min  ?Stress: No Stress Concern Present  ? Feeling of Stress : Not at all  ?Social Connections: Socially Integrated  ? Frequency of Communication with Friends and Family: More than three times a week  ? Frequency of Social Gatherings with Friends and Family: More than three times a week  ? Attends Religious Services: More than 4 times per year  ? Active Member of Clubs or Organizations: Yes  ? Attends Archivist Meetings: 1 to 4 times per year  ? Marital Status: Married  ? ? ?Tobacco Counseling ?Counseling given: Yes ? ? ?Clinical Intake: ? ?Pre-visit preparation completed: Yes ? ?Pain : No/denies pain ? ?  ? ?Diabetes: No ? ?  ? ?Diabetic?no  ? ?Interpreter Needed?: No ? ?  ? ? ?Activities of Daily Living ?   ? View : No data to display.  ?  ?  ?  ? ? ?Patient Care Team: ?Kerin Perna, NP as PCP - General (Internal Medicine) ?[facility] ? ?Indicate any recent Medical Services you may have received from other than [facility] providers in the past year (date may be approximate). ? ?   ?Assessment:  ? This is a routine wellness  examination for Casey Preston. ? ?Hearing/Vision screen ?No results found. ? ?Dietary issues and exercise activities discussed: ?  ? ? Goals Addressed   ?None ?  ?Depression Screen ? ?  10/24/2021  ?  1:05 PM 04/15/2021  ?  9:06 AM  ?PHQ 2/9 Scores  ?PHQ - 2 Score 0 0  ?  ?Fall Risk ? ?  10/24/2021  ?  1:08 PM 04/15/2021  ?  9:06 AM  ?Fall Risk   ?Falls in the past year? 0 0  ?Number falls in past yr: 0   ?Injury with Fall? 0   ?Risk for fall due to : No Fall Risks   ?Follow up Falls evaluation  completed   ? ? ?FALL RISK PREVENTION PERTAINING TO THE HOME: ? ?Any stairs in or around the home? Yes  ?If so, are there any without handrails? No  ?Home free of loose throw rugs in walkways, pet beds, electrical cords, etc? Yes  ?Adequate lighting in your home to reduce risk of falls? Yes  ? ?ASSISTIVE DEVICES UTILIZED TO PREVENT FALLS: ? ?Life alert? No  ?Use of a cane, walker or w/c? No  ?Grab bars in the bathroom? No  ?Shower chair or bench in shower? No  ?Elevated toilet seat or a handicapped toilet? No  ? ? ?Cognitive Function: ?  ?  ? ?  10/24/2021  ?  1:08 PM  ?6CIT Screen  ?What Year? 4 points  ?What month? 3 points  ?What time? 0 points  ?Count back from 20 0 points  ?Months in reverse 0 points  ?Repeat phrase 0 points  ?Total Score 7 points  ? ? ?Immunizations ?Immunization History  ?Administered Date(s) Administered  ? Pneumococcal Polysaccharide-23 07/15/2016  ? Td 07/28/2003  ? Tdap 11/23/2015, 06/17/2019  ? ? ?TDAP status: Completed at today's visit ? ?Flu Vaccine status: Up to date ? ?Pneumococcal vaccine status: Due, Education has been provided regarding the importance of this vaccine. Advised may receive this vaccine at local pharmacy or Health Dept. Aware to provide a copy of the vaccination record if obtained from local pharmacy or Health Dept. Verbalized acceptance and understanding. ? ?Covid-19 vaccine status: Information provided on how to obtain vaccines.  ? ?Qualifies for Shingles Vaccine? Yes   ?Zostavax  completed No   ?Shingrix Completed?: No.    Education has been provided regarding the importance of this vaccine. Patient has been advised to call insurance company to determine out of pocket expense if they have not yet received t

## 2023-06-21 ENCOUNTER — Encounter (HOSPITAL_COMMUNITY): Payer: Self-pay

## 2023-06-21 ENCOUNTER — Emergency Department (HOSPITAL_BASED_OUTPATIENT_CLINIC_OR_DEPARTMENT_OTHER): Payer: 59 | Admitting: Radiology

## 2023-06-21 ENCOUNTER — Emergency Department (HOSPITAL_BASED_OUTPATIENT_CLINIC_OR_DEPARTMENT_OTHER)
Admission: EM | Admit: 2023-06-21 | Discharge: 2023-06-21 | Disposition: A | Discharge status: Home / Self Care | Payer: 59 | Attending: Emergency Medicine | Admitting: Emergency Medicine

## 2023-06-21 ENCOUNTER — Other Ambulatory Visit: Payer: Self-pay

## 2023-06-21 ENCOUNTER — Encounter (HOSPITAL_BASED_OUTPATIENT_CLINIC_OR_DEPARTMENT_OTHER): Payer: Self-pay

## 2023-06-21 ENCOUNTER — Emergency Department (HOSPITAL_BASED_OUTPATIENT_CLINIC_OR_DEPARTMENT_OTHER): Payer: 59

## 2023-06-21 DIAGNOSIS — M25552 Pain in left hip: Secondary | ICD-10-CM | POA: Insufficient documentation

## 2023-06-21 DIAGNOSIS — Y92410 Unspecified street and highway as the place of occurrence of the external cause: Secondary | ICD-10-CM | POA: Insufficient documentation

## 2023-06-21 DIAGNOSIS — I120 Hypertensive chronic kidney disease with stage 5 chronic kidney disease or end stage renal disease: Secondary | ICD-10-CM | POA: Diagnosis not present

## 2023-06-21 DIAGNOSIS — Z940 Kidney transplant status: Secondary | ICD-10-CM | POA: Insufficient documentation

## 2023-06-21 DIAGNOSIS — M545 Low back pain, unspecified: Secondary | ICD-10-CM | POA: Diagnosis present

## 2023-06-21 DIAGNOSIS — E1122 Type 2 diabetes mellitus with diabetic chronic kidney disease: Secondary | ICD-10-CM | POA: Insufficient documentation

## 2023-06-21 DIAGNOSIS — N186 End stage renal disease: Secondary | ICD-10-CM | POA: Diagnosis not present

## 2023-06-21 DIAGNOSIS — M542 Cervicalgia: Secondary | ICD-10-CM | POA: Diagnosis not present

## 2023-06-21 MED ORDER — ACETAMINOPHEN 325 MG PO TABS
650.0000 mg | ORAL_TABLET | Freq: Once | ORAL | Status: AC
  Administered 2023-06-21: 650 mg via ORAL
  Filled 2023-06-21: qty 2

## 2023-06-21 MED ORDER — LIDOCAINE 5 % EX PTCH
1.0000 | MEDICATED_PATCH | CUTANEOUS | Status: DC
  Administered 2023-06-21: 1 via TRANSDERMAL
  Filled 2023-06-21: qty 1

## 2023-06-21 MED ORDER — CYCLOBENZAPRINE HCL 10 MG PO TABS: 10.0000 mg | ORAL_TABLET | Freq: Two times a day (BID) | ORAL | 0 refills | Status: AC | PRN

## 2023-06-21 NOTE — ED Notes (Signed)
Discharge paperwork given and verbally understood. 
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
## 2023-06-21 NOTE — Discharge Instructions (Signed)
As discussed, CT imaging of your neck did not show any evidence of neck fracture or dislocation.  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  Will recommend continued use of Tylenol at home for treatment of pain.  Will also send you home with muscle relaxer to use as needed.  Note this medication can cause drowsiness so please do not drive or perform any high risk activity and to realize its effects on you.  Will also send in the numbing patches to place over areas of pain.  Recommend follow-up with primary care for reassessment of your symptoms.  Please do not hesitate to return if the worrisome signs and symptoms we discussed become apparent.

## 2023-08-04 DIAGNOSIS — E113512 Type 2 diabetes mellitus with proliferative diabetic retinopathy with macular edema, left eye: Secondary | ICD-10-CM | POA: Diagnosis not present

## 2023-08-10 DIAGNOSIS — Z940 Kidney transplant status: Secondary | ICD-10-CM | POA: Diagnosis not present

## 2023-09-07 DIAGNOSIS — Z940 Kidney transplant status: Secondary | ICD-10-CM | POA: Diagnosis not present

## 2023-09-29 DIAGNOSIS — E113512 Type 2 diabetes mellitus with proliferative diabetic retinopathy with macular edema, left eye: Secondary | ICD-10-CM | POA: Diagnosis not present

## 2023-10-07 DIAGNOSIS — E113511 Type 2 diabetes mellitus with proliferative diabetic retinopathy with macular edema, right eye: Secondary | ICD-10-CM | POA: Diagnosis not present

## 2023-11-03 DIAGNOSIS — Z940 Kidney transplant status: Secondary | ICD-10-CM | POA: Diagnosis not present

## 2023-11-18 DIAGNOSIS — E113511 Type 2 diabetes mellitus with proliferative diabetic retinopathy with macular edema, right eye: Secondary | ICD-10-CM | POA: Diagnosis not present

## 2023-11-24 DIAGNOSIS — E113512 Type 2 diabetes mellitus with proliferative diabetic retinopathy with macular edema, left eye: Secondary | ICD-10-CM | POA: Diagnosis not present

## 2023-12-15 DIAGNOSIS — Z940 Kidney transplant status: Secondary | ICD-10-CM | POA: Diagnosis not present

## 2023-12-22 DIAGNOSIS — Z940 Kidney transplant status: Secondary | ICD-10-CM | POA: Diagnosis not present

## 2023-12-30 DIAGNOSIS — E113511 Type 2 diabetes mellitus with proliferative diabetic retinopathy with macular edema, right eye: Secondary | ICD-10-CM | POA: Diagnosis not present

## 2024-01-12 DIAGNOSIS — D638 Anemia in other chronic diseases classified elsewhere: Secondary | ICD-10-CM | POA: Diagnosis not present

## 2024-01-12 DIAGNOSIS — Z794 Long term (current) use of insulin: Secondary | ICD-10-CM | POA: Diagnosis not present

## 2024-01-12 DIAGNOSIS — Z940 Kidney transplant status: Secondary | ICD-10-CM | POA: Diagnosis not present

## 2024-01-12 DIAGNOSIS — E1165 Type 2 diabetes mellitus with hyperglycemia: Secondary | ICD-10-CM | POA: Diagnosis not present

## 2024-01-12 DIAGNOSIS — Z792 Long term (current) use of antibiotics: Secondary | ICD-10-CM | POA: Diagnosis not present

## 2024-01-12 DIAGNOSIS — D849 Immunodeficiency, unspecified: Secondary | ICD-10-CM | POA: Diagnosis not present

## 2024-01-12 DIAGNOSIS — Z4822 Encounter for aftercare following kidney transplant: Secondary | ICD-10-CM | POA: Diagnosis not present

## 2024-01-12 DIAGNOSIS — Z7969 Long term (current) use of other immunomodulators and immunosuppressants: Secondary | ICD-10-CM | POA: Diagnosis not present

## 2024-02-14 DIAGNOSIS — Z940 Kidney transplant status: Secondary | ICD-10-CM | POA: Diagnosis not present

## 2024-02-15 DIAGNOSIS — E113511 Type 2 diabetes mellitus with proliferative diabetic retinopathy with macular edema, right eye: Secondary | ICD-10-CM | POA: Diagnosis not present

## 2024-02-17 DIAGNOSIS — E113512 Type 2 diabetes mellitus with proliferative diabetic retinopathy with macular edema, left eye: Secondary | ICD-10-CM | POA: Diagnosis not present

## 2024-03-28 DIAGNOSIS — Z940 Kidney transplant status: Secondary | ICD-10-CM | POA: Diagnosis not present

## 2024-04-04 DIAGNOSIS — E113513 Type 2 diabetes mellitus with proliferative diabetic retinopathy with macular edema, bilateral: Secondary | ICD-10-CM | POA: Diagnosis not present

## 2024-05-24 ENCOUNTER — Telehealth (INDEPENDENT_AMBULATORY_CARE_PROVIDER_SITE_OTHER): Payer: Self-pay

## 2024-05-24 ENCOUNTER — Encounter (INDEPENDENT_AMBULATORY_CARE_PROVIDER_SITE_OTHER): Payer: Self-pay

## 2024-05-24 NOTE — Telephone Encounter (Signed)
 Patient is overdue for an appointment. LMOM for patient to call and schedule. Mychart message sent as well.

## 2024-06-01 ENCOUNTER — Encounter: Payer: Self-pay | Admitting: *Deleted

## 2024-06-01 NOTE — Progress Notes (Signed)
 Casey Preston                                          MRN: [id number]   06/01/2024   The [facility] Quality Team Specialist reviewed this patient medical record for the purposes of chart review for care gap closure. The following were reviewed: chart review for care gap closure-colorectal cancer screening.    [facility] Quality Team

## 2024-06-08 NOTE — Telephone Encounter (Signed)
 Called patient to schedule yearly office visit with PCP. Left message.
